# Patient Record
Sex: Female | Born: 1970 | Race: White | Hispanic: No | Marital: Married | Smoking: Never smoker
Health system: Southern US, Community
[De-identification: ages and names within clinical notes are randomized; demographics above are authoritative.]

## PROBLEM LIST (undated history)

## (undated) DIAGNOSIS — E611 Iron deficiency: Secondary | ICD-10-CM

## (undated) DIAGNOSIS — J45909 Unspecified asthma, uncomplicated: Secondary | ICD-10-CM

## (undated) DIAGNOSIS — J40 Bronchitis, not specified as acute or chronic: Secondary | ICD-10-CM

## (undated) DIAGNOSIS — R112 Nausea with vomiting, unspecified: Secondary | ICD-10-CM

## (undated) DIAGNOSIS — Z9889 Other specified postprocedural states: Secondary | ICD-10-CM

## (undated) HISTORY — PX: CYST EXCISION: SHX5701

## (undated) HISTORY — PX: VARICOSE VEIN SURGERY: SHX832

## (undated) HISTORY — PX: TUBAL LIGATION: SHX77

---

## 1997-08-09 ENCOUNTER — Inpatient Hospital Stay (HOSPITAL_COMMUNITY): Admission: AD | Admit: 1997-08-09 | Discharge: 1997-08-11 | Payer: Self-pay | Admitting: Obstetrics

## 2000-05-28 ENCOUNTER — Observation Stay (HOSPITAL_COMMUNITY): Admission: AD | Admit: 2000-05-28 | Discharge: 2000-05-29 | Payer: Self-pay | Admitting: Obstetrics & Gynecology

## 2000-06-04 ENCOUNTER — Inpatient Hospital Stay (HOSPITAL_COMMUNITY): Admission: AD | Admit: 2000-06-04 | Discharge: 2000-06-04 | Payer: Self-pay | Admitting: *Deleted

## 2000-06-23 ENCOUNTER — Other Ambulatory Visit: Admission: RE | Admit: 2000-06-23 | Discharge: 2000-06-23 | Payer: Self-pay | Admitting: Obstetrics

## 2001-01-07 ENCOUNTER — Encounter (INDEPENDENT_AMBULATORY_CARE_PROVIDER_SITE_OTHER): Payer: Self-pay | Admitting: Specialist

## 2001-01-07 ENCOUNTER — Inpatient Hospital Stay (HOSPITAL_COMMUNITY): Admission: AD | Admit: 2001-01-07 | Discharge: 2001-01-09 | Payer: Self-pay | Admitting: Obstetrics

## 2001-01-14 ENCOUNTER — Inpatient Hospital Stay (HOSPITAL_COMMUNITY): Admission: AD | Admit: 2001-01-14 | Discharge: 2001-01-16 | Payer: Self-pay | Admitting: Obstetrics

## 2009-03-08 ENCOUNTER — Emergency Department (HOSPITAL_COMMUNITY): Admission: EM | Admit: 2009-03-08 | Discharge: 2009-03-08 | Payer: Self-pay | Admitting: Emergency Medicine

## 2010-02-25 ENCOUNTER — Encounter: Admission: RE | Admit: 2010-02-25 | Discharge: 2010-02-25 | Payer: Self-pay | Admitting: Family Medicine

## 2010-08-28 NOTE — Discharge Summary (Signed)
[facility]  Patient:    Tara Houston, Tara Houston Visit Number: [id number] MRN: [id number]          Service Type: OBS Location: 910A 9139 01 Attending Physician:  Venita Sheffield Dictated by:   Kathreen Cosier, M.D. Admit Date:  01/07/2001 Disc. Date: 01/09/01                             Discharge Summary  HOSPITAL COURSE:              The patient is a 40 year old, gravida 6, para 2-0-0-3, [facility] January 05, 2001 who was admitted in labor 7 cm, 100%, vertex, -1. She had a normal vaginal delivery of a female, Apgars 9 and 9; no episiotomy, no laceration. The patient desired sterilization. On admission, her hemoglobin was 12.1 and at discharge was 10.4. She underwent a postpartum tubal on January 08, 2001. Postoperatively, she did well and was discharged home on the second postpartum day, ambulatory on a regular diet.  DISCHARGE MEDICATIONS:        Tylenol #3.  DISCHARGE FOLLOWUP:           The patient is to see Dr. Gaynell Face in six weeks.  DISCHARGE DIAGNOSES:          1. Status post normal vaginal delivery at term.                               2. Postpartum tubal ligation. Dictated by:   Kathreen Cosier, M.D. Attending Physician:  Venita Sheffield DD:  01/09/01 TD:  01/09/01 Job: [id number]

## 2010-08-28 NOTE — Op Note (Signed)
[facility]  Patient:    Tara Houston, Tara Houston Visit Number: [id number] MRN: [id number]          Service Type: Attending:  Kathreen Cosier, M.D. Dictated by:   Kathreen Cosier, M.D. Proc. Date: 01/08/01                             Operative Report  PREOPERATIVE DIAGNOSIS:       Multiparity.  OPERATION:                    Postpartum tubal ligation.  SURGEON:                      Kathreen Cosier, M.D.  ANESTHESIA:                   General.  Failed epidural.  DESCRIPTION OF PROCEDURE:     The patient was placed on the operating table in the supine position.  General anesthesia was administered.  The abdomen was prepped and draped.  The bladder was emptied with a straight catheter.  A midline subumbilical incision one inch long was made and carried down to the fascia.  The fascia was cleanly grasped with two Kochers.  The fascia and the peritoneum was opened with Mayo scissors.  The left tube was grasped in the midportion with a Babcock clamp.  The tube was traced to the fimbria.  Then, 0 plain suture was placed in the midsalpinx below the portion of tube in the clamp.  This was tied and approximately one inch of tube was transected. Hemostasis was satisfactory.  The procedure was done in a similar fashion on the other side.  The abdomen was closed in layers.  The peritoneum and fascia were closed with continuous suture of 2-0 Dexon.  The skin was closed with subcuticular suture of 3-0 plain.  Blood loss was 5 cc.  The patient tolerated the procedure well and was taken to the recovery room in good condition. Dictated by:   Kathreen Cosier, M.D. Attending:  Kathreen Cosier, M.D. DD:  01/08/01 TD:  01/08/01 Job: [id number]

## 2010-08-28 NOTE — Discharge Summary (Signed)
[facility]  Patient:    SHEQUITA, PEPLINSKI Visit Number: [id number] MRN: [id number]          Service Type: MED Location: 9300 [id number] Attending Physician:  Venita Sheffield Dictated by:   Kathreen Cosier, M.D. Admit Date:  01/14/2001 Discharge Date: 01/16/2001                             Discharge Summary  HISTORY:                      Patient is a 40 year old gravida 6, para 2-0-3-2, [facility] January 05, 2001 who was admitted in labor.  Had a normal vaginal delivery of a female Apgar 9/9.  The patient desired sterilization. On admission hemoglobin was 12.1, post delivery 10.4.  On September 29 she underwent a postpartum tubal ligation and did well and was discharged home on September 30 to see me in six weeks.  DISCHARGE DIAGNOSES:          Status post normal vaginal delivery at term, postpartum tubal ligation. Dictated by:   Kathreen Cosier, M.D. Attending Physician:  Venita Sheffield DD:  04/15/01 TD:  04/15/01 Job: [id number]

## 2012-07-11 ENCOUNTER — Telehealth: Payer: Self-pay | Admitting: Neurology

## 2012-07-11 NOTE — Telephone Encounter (Signed)
Called the patient and left a message on voicemail to return call to the office regarding referral for sleep consultation.

## 2012-07-15 ENCOUNTER — Encounter: Payer: Self-pay | Admitting: Neurology

## 2012-07-15 NOTE — Progress Notes (Signed)
Elpidio Anis, PA is referring this patient for the evaluation of sleep apnea.  Wt. 248 lbs., Ht. 66 in., BMI - 40.05  EDS Fatigue Snoring -Thunderous Morbid Obesity Non-Restorative Sleep Morning Headache Witnessed Apneas  Medication List Pt is currently not taking any medications.  This patient presents to Elpidio Anis, [place] with concern over her excessive daytime sleepiness and daytime fatigue.  She endorses Epworth at 14.  She says that she has trouble staying awake during the daytime even when she sleeps at night.  Her husband and children complain that the patients snoring is thunderous.  Pt says that she awakens frequently from her snoring while noticing at the time that she was breathing differently.  Pt has had a 16 lb weight gain within the past 5 months.  Elpidio Anis, PA feels patients symptoms are highly suspicious for sleep apnea.

## 2012-07-17 NOTE — Progress Notes (Signed)
This patient qualifies for attended sleep study based on clinical history. Morbid obesity and additional weight gain, Epworth .   Patient will need a SPLIT at AHI 10 and 3% scoring.  Offer lunesta prn,  A CO2 measurement is  necessary.

## 2012-07-18 ENCOUNTER — Other Ambulatory Visit: Payer: Self-pay | Admitting: Neurology

## 2012-07-18 ENCOUNTER — Other Ambulatory Visit: Payer: Self-pay | Admitting: *Deleted

## 2012-07-18 DIAGNOSIS — G4730 Sleep apnea, unspecified: Secondary | ICD-10-CM

## 2012-07-18 DIAGNOSIS — R0683 Snoring: Secondary | ICD-10-CM

## 2012-07-18 DIAGNOSIS — G4733 Obstructive sleep apnea (adult) (pediatric): Secondary | ICD-10-CM

## 2015-03-31 ENCOUNTER — Other Ambulatory Visit: Payer: Self-pay | Admitting: Obstetrics and Gynecology

## 2015-03-31 DIAGNOSIS — D249 Benign neoplasm of unspecified breast: Secondary | ICD-10-CM

## 2015-04-25 ENCOUNTER — Other Ambulatory Visit: Payer: Self-pay | Admitting: Obstetrics and Gynecology

## 2015-04-25 DIAGNOSIS — D251 Intramural leiomyoma of uterus: Secondary | ICD-10-CM

## 2015-05-02 ENCOUNTER — Ambulatory Visit
Admission: RE | Admit: 2015-05-02 | Discharge: 2015-05-02 | Disposition: A | Discharge status: Home / Self Care | Payer: No Typology Code available for payment source | Source: Ambulatory Visit | Attending: Obstetrics and Gynecology | Admitting: Obstetrics and Gynecology

## 2015-05-02 ENCOUNTER — Other Ambulatory Visit: Payer: Self-pay

## 2015-05-02 DIAGNOSIS — D251 Intramural leiomyoma of uterus: Secondary | ICD-10-CM

## 2015-05-28 ENCOUNTER — Other Ambulatory Visit: Payer: Self-pay | Admitting: Obstetrics and Gynecology

## 2015-05-28 DIAGNOSIS — R928 Other abnormal and inconclusive findings on diagnostic imaging of breast: Secondary | ICD-10-CM

## 2015-06-03 ENCOUNTER — Ambulatory Visit
Admission: RE | Admit: 2015-06-03 | Discharge: 2015-06-03 | Disposition: A | Discharge status: Home / Self Care | Payer: No Typology Code available for payment source | Source: Ambulatory Visit | Attending: Obstetrics and Gynecology | Admitting: Obstetrics and Gynecology

## 2015-06-03 DIAGNOSIS — R928 Other abnormal and inconclusive findings on diagnostic imaging of breast: Secondary | ICD-10-CM

## 2015-06-13 ENCOUNTER — Other Ambulatory Visit: Payer: Self-pay | Admitting: Obstetrics and Gynecology

## 2015-06-13 DIAGNOSIS — D259 Leiomyoma of uterus, unspecified: Secondary | ICD-10-CM

## 2015-06-14 ENCOUNTER — Ambulatory Visit
Admission: RE | Admit: 2015-06-14 | Discharge: 2015-06-14 | Disposition: A | Discharge status: Home / Self Care | Payer: Self-pay | Source: Ambulatory Visit | Attending: Obstetrics and Gynecology | Admitting: Obstetrics and Gynecology

## 2015-06-14 DIAGNOSIS — D259 Leiomyoma of uterus, unspecified: Secondary | ICD-10-CM

## 2015-11-03 ENCOUNTER — Other Ambulatory Visit: Payer: Self-pay | Admitting: Obstetrics and Gynecology

## 2015-11-03 DIAGNOSIS — D259 Leiomyoma of uterus, unspecified: Secondary | ICD-10-CM

## 2015-11-04 ENCOUNTER — Other Ambulatory Visit: Payer: Self-pay

## 2015-11-10 ENCOUNTER — Other Ambulatory Visit: Payer: Self-pay | Admitting: Obstetrics and Gynecology

## 2015-11-10 DIAGNOSIS — D259 Leiomyoma of uterus, unspecified: Secondary | ICD-10-CM

## 2015-11-18 ENCOUNTER — Ambulatory Visit
Admission: RE | Admit: 2015-11-18 | Discharge: 2015-11-18 | Disposition: A | Discharge status: Home / Self Care | Payer: Self-pay | Source: Ambulatory Visit | Attending: Obstetrics and Gynecology | Admitting: Obstetrics and Gynecology

## 2015-11-18 ENCOUNTER — Other Ambulatory Visit: Payer: Self-pay | Admitting: Obstetrics and Gynecology

## 2015-11-18 DIAGNOSIS — D259 Leiomyoma of uterus, unspecified: Secondary | ICD-10-CM

## 2015-11-18 DIAGNOSIS — R921 Mammographic calcification found on diagnostic imaging of breast: Secondary | ICD-10-CM

## 2015-11-19 ENCOUNTER — Ambulatory Visit
Admission: RE | Admit: 2015-11-19 | Discharge: 2015-11-19 | Disposition: A | Discharge status: Home / Self Care | Payer: No Typology Code available for payment source | Source: Ambulatory Visit | Attending: Obstetrics and Gynecology | Admitting: Obstetrics and Gynecology

## 2015-11-19 DIAGNOSIS — D259 Leiomyoma of uterus, unspecified: Secondary | ICD-10-CM

## 2015-11-26 ENCOUNTER — Ambulatory Visit
Admission: RE | Admit: 2015-11-26 | Discharge: 2015-11-26 | Disposition: A | Discharge status: Home / Self Care | Payer: 59 | Source: Ambulatory Visit | Attending: Obstetrics and Gynecology | Admitting: Obstetrics and Gynecology

## 2015-11-26 DIAGNOSIS — R921 Mammographic calcification found on diagnostic imaging of breast: Secondary | ICD-10-CM

## 2016-04-21 ENCOUNTER — Other Ambulatory Visit: Payer: Self-pay | Admitting: Obstetrics and Gynecology

## 2016-04-21 DIAGNOSIS — D259 Leiomyoma of uterus, unspecified: Secondary | ICD-10-CM

## 2016-05-06 ENCOUNTER — Ambulatory Visit
Admission: RE | Admit: 2016-05-06 | Discharge: 2016-05-06 | Disposition: A | Discharge status: Home / Self Care | Payer: No Typology Code available for payment source | Source: Ambulatory Visit | Attending: Obstetrics and Gynecology | Admitting: Obstetrics and Gynecology

## 2016-05-06 DIAGNOSIS — D259 Leiomyoma of uterus, unspecified: Secondary | ICD-10-CM

## 2016-05-08 ENCOUNTER — Ambulatory Visit
Admission: RE | Admit: 2016-05-08 | Discharge: 2016-05-08 | Disposition: A | Discharge status: Home / Self Care | Payer: No Typology Code available for payment source | Source: Ambulatory Visit | Attending: Obstetrics and Gynecology | Admitting: Obstetrics and Gynecology

## 2016-05-08 DIAGNOSIS — D259 Leiomyoma of uterus, unspecified: Secondary | ICD-10-CM

## 2016-05-14 ENCOUNTER — Other Ambulatory Visit: Payer: Self-pay | Admitting: Obstetrics and Gynecology

## 2016-05-14 DIAGNOSIS — R921 Mammographic calcification found on diagnostic imaging of breast: Secondary | ICD-10-CM

## 2016-05-19 ENCOUNTER — Ambulatory Visit
Admission: RE | Admit: 2016-05-19 | Discharge: 2016-05-19 | Disposition: A | Discharge status: Home / Self Care | Payer: No Typology Code available for payment source | Source: Ambulatory Visit | Attending: Obstetrics and Gynecology | Admitting: Obstetrics and Gynecology

## 2016-05-19 DIAGNOSIS — R921 Mammographic calcification found on diagnostic imaging of breast: Secondary | ICD-10-CM

## 2016-07-21 ENCOUNTER — Other Ambulatory Visit: Payer: Self-pay | Admitting: Obstetrics and Gynecology

## 2016-07-27 ENCOUNTER — Other Ambulatory Visit: Payer: Self-pay | Admitting: Obstetrics and Gynecology

## 2016-07-27 DIAGNOSIS — D259 Leiomyoma of uterus, unspecified: Secondary | ICD-10-CM

## 2016-08-04 ENCOUNTER — Ambulatory Visit
Admission: RE | Admit: 2016-08-04 | Discharge: 2016-08-04 | Disposition: A | Discharge status: Home / Self Care | Payer: No Typology Code available for payment source | Source: Ambulatory Visit | Attending: Obstetrics and Gynecology | Admitting: Obstetrics and Gynecology

## 2016-08-04 DIAGNOSIS — D259 Leiomyoma of uterus, unspecified: Secondary | ICD-10-CM

## 2016-10-25 ENCOUNTER — Other Ambulatory Visit: Payer: Self-pay | Admitting: Obstetrics and Gynecology

## 2016-10-25 DIAGNOSIS — D259 Leiomyoma of uterus, unspecified: Secondary | ICD-10-CM

## 2016-10-25 DIAGNOSIS — Z006 Encounter for examination for normal comparison and control in clinical research program: Secondary | ICD-10-CM

## 2016-11-03 ENCOUNTER — Ambulatory Visit
Admission: RE | Admit: 2016-11-03 | Discharge: 2016-11-03 | Disposition: A | Discharge status: Home / Self Care | Payer: No Typology Code available for payment source | Source: Ambulatory Visit | Attending: Obstetrics and Gynecology | Admitting: Obstetrics and Gynecology

## 2016-11-03 DIAGNOSIS — D259 Leiomyoma of uterus, unspecified: Secondary | ICD-10-CM

## 2016-11-03 DIAGNOSIS — Z006 Encounter for examination for normal comparison and control in clinical research program: Secondary | ICD-10-CM

## 2017-03-28 ENCOUNTER — Other Ambulatory Visit: Payer: Self-pay | Admitting: Obstetrics and Gynecology

## 2017-03-28 DIAGNOSIS — Z006 Encounter for examination for normal comparison and control in clinical research program: Secondary | ICD-10-CM

## 2017-04-21 ENCOUNTER — Ambulatory Visit
Admission: RE | Admit: 2017-04-21 | Discharge: 2017-04-21 | Disposition: A | Discharge status: Home / Self Care | Payer: No Typology Code available for payment source | Source: Ambulatory Visit | Attending: Obstetrics and Gynecology | Admitting: Obstetrics and Gynecology

## 2017-04-21 DIAGNOSIS — Z006 Encounter for examination for normal comparison and control in clinical research program: Secondary | ICD-10-CM

## 2017-06-24 ENCOUNTER — Other Ambulatory Visit: Payer: Self-pay | Admitting: Obstetrics and Gynecology

## 2017-06-24 DIAGNOSIS — R921 Mammographic calcification found on diagnostic imaging of breast: Secondary | ICD-10-CM

## 2017-07-15 ENCOUNTER — Ambulatory Visit
Admission: RE | Admit: 2017-07-15 | Discharge: 2017-07-15 | Disposition: A | Discharge status: Home / Self Care | Payer: 59 | Source: Ambulatory Visit | Attending: Obstetrics and Gynecology | Admitting: Obstetrics and Gynecology

## 2017-07-15 ENCOUNTER — Other Ambulatory Visit: Payer: Self-pay | Admitting: Obstetrics and Gynecology

## 2017-07-15 ENCOUNTER — Encounter: Payer: Self-pay | Admitting: Radiology

## 2017-07-15 DIAGNOSIS — N6310 Unspecified lump in the right breast, unspecified quadrant: Secondary | ICD-10-CM

## 2017-07-15 DIAGNOSIS — R921 Mammographic calcification found on diagnostic imaging of breast: Secondary | ICD-10-CM

## 2018-09-08 ENCOUNTER — Other Ambulatory Visit: Payer: Self-pay | Admitting: Obstetrics and Gynecology

## 2018-09-08 DIAGNOSIS — Z1231 Encounter for screening mammogram for malignant neoplasm of breast: Secondary | ICD-10-CM

## 2018-10-27 ENCOUNTER — Ambulatory Visit
Admission: RE | Admit: 2018-10-27 | Discharge: 2018-10-27 | Disposition: A | Discharge status: Home / Self Care | Payer: 59 | Source: Ambulatory Visit | Attending: Obstetrics and Gynecology | Admitting: Obstetrics and Gynecology

## 2018-10-27 ENCOUNTER — Other Ambulatory Visit: Payer: Self-pay

## 2018-10-27 DIAGNOSIS — Z1231 Encounter for screening mammogram for malignant neoplasm of breast: Secondary | ICD-10-CM

## 2019-06-16 ENCOUNTER — Ambulatory Visit: Payer: 59 | Attending: Internal Medicine

## 2019-06-16 DIAGNOSIS — Z23 Encounter for immunization: Secondary | ICD-10-CM

## 2019-06-16 NOTE — Progress Notes (Signed)
   Covid-19 Vaccination Clinic  Name:  Tara Houston    MRN: [id number] DOB: Nov 23, 1970  06/16/2019  Tara Houston was observed post Covid-19 immunization for 15 minutes without incident. She was provided with Vaccine Information Sheet and instruction to access the V-Safe system.   Tara Houston was instructed to call 911 with any severe reactions post vaccine: Marland Kitchen Difficulty breathing  . Swelling of face and throat  . A fast heartbeat  . A bad rash all over body  . Dizziness and weakness   Immunizations Administered    Name Date Dose VIS Date Route   Pfizer COVID-19 Vaccine 06/16/2019 11:24 AM 0.3 mL 03/23/2019 Intramuscular   Manufacturer: [place]   Lot: [id number]   [place]: [id number]

## 2019-07-07 ENCOUNTER — Ambulatory Visit: Payer: 59 | Attending: Internal Medicine

## 2019-07-07 DIAGNOSIS — Z23 Encounter for immunization: Secondary | ICD-10-CM

## 2019-07-07 NOTE — Progress Notes (Signed)
   Covid-19 Vaccination Clinic  Name:  Donisha Kluttz    MRN: [id number] DOB: 1971/02/24  07/07/2019  Ms. Martinie was observed post Covid-19 immunization for 15 minutes without incident. She was provided with Vaccine Information Sheet and instruction to access the V-Safe system.   Ms. Howington was instructed to call 911 with any severe reactions post vaccine: Marland Kitchen Difficulty breathing  . Swelling of face and throat  . A fast heartbeat  . A bad rash all over body  . Dizziness and weakness   Immunizations Administered    Name Date Dose VIS Date Route   Pfizer COVID-19 Vaccine 07/07/2019 11:02 AM 0.3 mL 03/23/2019 Intramuscular   Manufacturer: [place]   Lot: [id number]   [place]: [id number]

## 2019-09-26 ENCOUNTER — Other Ambulatory Visit: Payer: Self-pay | Admitting: Obstetrics & Gynecology

## 2019-09-26 DIAGNOSIS — Z1231 Encounter for screening mammogram for malignant neoplasm of breast: Secondary | ICD-10-CM

## 2019-10-29 ENCOUNTER — Other Ambulatory Visit: Payer: Self-pay

## 2019-10-29 ENCOUNTER — Ambulatory Visit
Admission: RE | Admit: 2019-10-29 | Discharge: 2019-10-29 | Disposition: A | Discharge status: Home / Self Care | Payer: 59 | Source: Ambulatory Visit | Attending: Obstetrics & Gynecology | Admitting: Obstetrics & Gynecology

## 2019-10-29 DIAGNOSIS — Z1231 Encounter for screening mammogram for malignant neoplasm of breast: Secondary | ICD-10-CM

## 2019-11-11 NOTE — H&P (Signed)
[name] who presents for HSC/D&C/ablation due to AUB:  In review, recently had prolonged/moderate to heavy period with 2 weeks of light spotting then a very heavy bleeding x 1wk- she had been called in progesterone, which did in fact improve her bleeding.  The irregular bleeding has been an ongoing issue.  Over the past few months, she may have bleeding for up to 3 weeks- bleeding may vary from light to moderate/heavy flow. On a moderate day she may use 5-6 pads/tampons per day. Bleeding will stop for a week and then return. This started in Tara Houston- currently bleeding. On the plus side, has less dysmenorrhea than she used to.  Work up included: [place] (10/11/19): 11.9cm anteverted uterus with several fibroids- largest 3.4cm (intramural). Thickened endometrium- no BV noted. Left ovary with 2.8cm cyst- low level internal echoes- avascular. Right ovary not visualized. EMB: 10/2019- benign proliferative endometrium  Denies abnormal discharge, itching or irritation. Denies pelvic or abdominal pain outside of her period. No urinary concerns. No sexual dysfunction. No other acute complaints  Contraception: tubal ligation.      ROS:  CONSTITUTIONAL:  no Appetite changes. no Chills. no Fatigue. no Fever.  CARDIOLOGY:  no Chest pain.  RESPIRATORY:  no Shortness of breath. no Cough.  UROLOGY:  no Dysuria. no Urinary frequency. no Urinary incontinence. no Urinary urgency.  GASTROENTEROLOGY:  no Abdominal pain. no Change in bowel habits. no Change in bowel movements.  FEMALE REPRODUCTIVE:  See HPI for details. Abnormal vaginal bleeding yes. no Abnormal vaginal discharge. no Breast lumps or discharge. no Breast pain. no Hot flashes. no Sexual problems. no Vaginal irritation. no Vaginal itching.  NEUROLOGY:  no Dizziness. no Headache.  PSYCHOLOGY:  no Anxiety. no Depression.  SKIN:  no Rash. no Hives.  HEMATOLOGY/LYMPH:  no Anemia. Using Blood Thinners no.         Medical History: Vascular  disease, Fibroids.        Gyn History:  Sexual activity currently sexually active.  Periods : every month.  LMP 09/29/2019.  Birth control BTL.  Last pap smear date 2019 .  Last mammogram date july 2020.  STD HPV once .  GYN procedures LEEP.  Other: h/o fibroids.        OB History:  Number of pregnancies 6.  abortion 3.  Pregnancy # 1 vaginal delivery.  Pregnancy # 2 vaginal delivery.  Pregnancy # 3 vaginal delivery.        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        Hospitalization/Major Diagnostic Procedure: child birth .        Family History: Father: deceased, dematia, diagnosed with Diabetes. Mother: alive, heart disease , breast cancer.        Social History:  General:  Tobacco use  cigarettes: Never smoked Tobacco history last updated 11/06/2019 EXPOSURE TO PASSIVE SMOKE: no.  Alcohol: wine, occasionally.  Caffeine: yes,, 2 servings daily.  Marital Status: Married.  Children: 3, girls.        Medications:  Vitamin B 12 500 MCG Tablet 1 tablet Orally Once a day,  Vitamin Supplement E-400(Vitamin E) 400 UNIT Capsule 1 capsule Orally Once a day, Vitamin C 500 MG Capsule as directed Orally , Not-Taking medroxyPROGESTERone Acetate 10 MG Tablet TAKE 1 TABLET BY MOUTH TWICE DAILY WITH FOOD FOR 10 DAYS , Not-Taking Junel Fe 24(Norethin Ace-Eth Estrad-FE) 1-20 MG-MCG(24) Tablet 1 tablet Orally Once a day, Discontinued Vitamin  B12 100 MCG Tablet as directed Orally , Discontinued Glucosamine-Chondroitin Sulf - Tablet as directed Orally , Medication List reviewed and reconciled with the patient       Allergies: Seasonal.    Objective:     Vitals: Wt 247.7, Wt change -4 lb, Ht 68, BMI 37.66, Pulse sitting 96, BP sitting 124/80.       Examination:  General Examination: CONSTITUTIONAL: well developed, well nourished.  SKIN: warm and dry, no rashes.  NECK: supple, normal appearance.  LUNGS: regular  breathing rate and effort.  HEART: no murmurs, regular rate and rhythm.  ABDOMEN: soft and not tender, no rebound, no rigidity.  FEMALE GENITOURINARY: normal external genitalia, labia - unremarkable, vagina - pink moist mucosa, no lesions or abnormal discharge, cervix - no discharge or lesions or CMT.  MUSCULOSKELETAL no calf tenderness bilaterally.  EXTREMITIES: no edema present.  PSYCH: appropriate mood and affect.    A/P: 03TC [id number] who presents for hysteroscopy, D&C, ablation due to AUB -NPO -LR @ 125cc/hr -SCDs to OR -Risk/benefit and alternatives reviewed including risk of bleeding, infection and injury due to uterine perforation.  Questions and concerns were addressed and she desires to proceed  Tara Pupa, DO [phone] (cell) [phone] (office)

## 2019-11-13 ENCOUNTER — Other Ambulatory Visit: Payer: Self-pay

## 2019-11-13 ENCOUNTER — Encounter (HOSPITAL_BASED_OUTPATIENT_CLINIC_OR_DEPARTMENT_OTHER): Payer: Self-pay | Admitting: Obstetrics & Gynecology

## 2019-11-17 ENCOUNTER — Other Ambulatory Visit (HOSPITAL_COMMUNITY)
Admission: RE | Admit: 2019-11-17 | Discharge: 2019-11-17 | Disposition: A | Discharge status: Home / Self Care | Payer: 59 | Source: Ambulatory Visit | Attending: Obstetrics & Gynecology | Admitting: Obstetrics & Gynecology

## 2019-11-17 DIAGNOSIS — Z01812 Encounter for preprocedural laboratory examination: Secondary | ICD-10-CM | POA: Diagnosis not present

## 2019-11-17 DIAGNOSIS — Z20822 Contact with and (suspected) exposure to covid-19: Secondary | ICD-10-CM | POA: Diagnosis not present

## 2019-11-17 LAB — SARS CORONAVIRUS 2 (TAT 6-24 HRS): SARS Coronavirus 2: NEGATIVE

## 2019-11-20 ENCOUNTER — Encounter (HOSPITAL_BASED_OUTPATIENT_CLINIC_OR_DEPARTMENT_OTHER)
Admission: RE | Admit: 2019-11-20 | Discharge: 2019-11-20 | Disposition: A | Discharge status: Home / Self Care | Payer: 59 | Source: Ambulatory Visit | Attending: Obstetrics & Gynecology | Admitting: Obstetrics & Gynecology

## 2019-11-20 DIAGNOSIS — N854 Malposition of uterus: Secondary | ICD-10-CM | POA: Diagnosis not present

## 2019-11-20 DIAGNOSIS — D251 Intramural leiomyoma of uterus: Secondary | ICD-10-CM | POA: Diagnosis not present

## 2019-11-20 DIAGNOSIS — N840 Polyp of corpus uteri: Secondary | ICD-10-CM | POA: Diagnosis not present

## 2019-11-20 DIAGNOSIS — Z01812 Encounter for preprocedural laboratory examination: Secondary | ICD-10-CM | POA: Diagnosis not present

## 2019-11-20 DIAGNOSIS — N8500 Endometrial hyperplasia, unspecified: Secondary | ICD-10-CM | POA: Diagnosis not present

## 2019-11-20 DIAGNOSIS — N939 Abnormal uterine and vaginal bleeding, unspecified: Secondary | ICD-10-CM | POA: Diagnosis not present

## 2019-11-20 LAB — CBC
HCT: 27.6 % — ABNORMAL LOW (ref 36.0–46.0)
Hemoglobin: 8 g/dL — ABNORMAL LOW (ref 12.0–15.0)
MCH: 22.8 pg — ABNORMAL LOW (ref 26.0–34.0)
MCHC: 29 g/dL — ABNORMAL LOW (ref 30.0–36.0)
MCV: 78.6 fL — ABNORMAL LOW (ref 80.0–100.0)
Platelets: 535 10*3/uL — ABNORMAL HIGH (ref 150–400)
RBC: 3.51 MIL/uL — ABNORMAL LOW (ref 3.87–5.11)
RDW: 14.9 % (ref 11.5–15.5)
WBC: 9.9 10*3/uL (ref 4.0–10.5)
nRBC: 0 % (ref 0.0–0.2)

## 2019-11-20 LAB — POCT PREGNANCY, URINE: Preg Test, Ur: NEGATIVE

## 2019-11-20 LAB — COMPREHENSIVE METABOLIC PANEL
ALT: 14 U/L (ref 0–44)
AST: 15 U/L (ref 15–41)
Albumin: 3.4 g/dL — ABNORMAL LOW (ref 3.5–5.0)
Alkaline Phosphatase: 57 U/L (ref 38–126)
Anion gap: 10 (ref 5–15)
BUN: 6 mg/dL (ref 6–20)
CO2: 25 mmol/L (ref 22–32)
Calcium: 9.2 mg/dL (ref 8.9–10.3)
Chloride: 103 mmol/L (ref 98–111)
Creatinine, Ser: 0.61 mg/dL (ref 0.44–1.00)
GFR calc Af Amer: >60 mL/min (ref >60)
GFR calc non Af Amer: >60 mL/min (ref >60)
Glucose, Bld: 94 mg/dL (ref 70–99)
Potassium: 3.7 mmol/L (ref 3.5–5.1)
Sodium: 138 mmol/L (ref 135–145)
Total Bilirubin: 0.4 mg/dL (ref 0.3–1.2)
Total Protein: 6.9 g/dL (ref 6.5–8.1)

## 2019-11-20 LAB — TYPE AND SCREEN
ABO/RH(D): A POS
Antibody Screen: NEGATIVE

## 2019-11-20 NOTE — Progress Notes (Signed)

## 2019-11-21 ENCOUNTER — Encounter (HOSPITAL_BASED_OUTPATIENT_CLINIC_OR_DEPARTMENT_OTHER): Payer: Self-pay | Admitting: Obstetrics & Gynecology

## 2019-11-21 ENCOUNTER — Ambulatory Visit (HOSPITAL_BASED_OUTPATIENT_CLINIC_OR_DEPARTMENT_OTHER)
Admission: RE | Admit: 2019-11-21 | Discharge: 2019-11-21 | Disposition: A | Discharge status: Home / Self Care | Payer: 59 | Attending: Obstetrics & Gynecology | Admitting: Obstetrics & Gynecology

## 2019-11-21 ENCOUNTER — Ambulatory Visit (HOSPITAL_BASED_OUTPATIENT_CLINIC_OR_DEPARTMENT_OTHER): Payer: 59 | Admitting: Certified Registered"

## 2019-11-21 ENCOUNTER — Encounter (HOSPITAL_BASED_OUTPATIENT_CLINIC_OR_DEPARTMENT_OTHER)
Admission: RE | Disposition: A | Discharge status: Home / Self Care | Payer: Self-pay | Source: Home / Self Care | Attending: Obstetrics & Gynecology

## 2019-11-21 ENCOUNTER — Other Ambulatory Visit: Payer: Self-pay

## 2019-11-21 DIAGNOSIS — N854 Malposition of uterus: Secondary | ICD-10-CM | POA: Insufficient documentation

## 2019-11-21 DIAGNOSIS — N840 Polyp of corpus uteri: Secondary | ICD-10-CM | POA: Insufficient documentation

## 2019-11-21 DIAGNOSIS — N939 Abnormal uterine and vaginal bleeding, unspecified: Secondary | ICD-10-CM

## 2019-11-21 DIAGNOSIS — Z01812 Encounter for preprocedural laboratory examination: Secondary | ICD-10-CM | POA: Insufficient documentation

## 2019-11-21 DIAGNOSIS — N8500 Endometrial hyperplasia, unspecified: Secondary | ICD-10-CM | POA: Insufficient documentation

## 2019-11-21 DIAGNOSIS — D251 Intramural leiomyoma of uterus: Secondary | ICD-10-CM | POA: Insufficient documentation

## 2019-11-21 HISTORY — DX: Unspecified asthma, uncomplicated: J45.909

## 2019-11-21 HISTORY — DX: Other specified postprocedural states: R11.2

## 2019-11-21 HISTORY — DX: Other specified postprocedural states: Z98.890

## 2019-11-21 HISTORY — PX: DILITATION & CURRETTAGE/HYSTROSCOPY WITH HYDROTHERMAL ABLATION: SHX5570

## 2019-11-21 SURGERY — DILATATION & CURETTAGE/HYSTEROSCOPY WITH HYDROTHERMAL ABLATION
Anesthesia: General | Site: Vagina

## 2019-11-21 MED ORDER — LIDOCAINE-EPINEPHRINE 1 %-1:100000 IJ SOLN
INTRAMUSCULAR | Status: AC
  Filled 2019-11-21: qty 1

## 2019-11-21 MED ORDER — SILVER NITRATE-POT NITRATE 75-25 % EX MISC
CUTANEOUS | Status: AC
  Filled 2019-11-21: qty 10

## 2019-11-21 MED ORDER — LACTATED RINGERS IV SOLN: INTRAVENOUS | Status: DC

## 2019-11-21 MED ORDER — HYDROMORPHONE HCL 1 MG/ML IJ SOLN
INTRAMUSCULAR | Status: AC
  Filled 2019-11-21: qty 0.5

## 2019-11-21 MED ORDER — PROPOFOL 10 MG/ML IV BOLUS
INTRAVENOUS | Status: AC
  Filled 2019-11-21: qty 20

## 2019-11-21 MED ORDER — DEXAMETHASONE SODIUM PHOSPHATE 10 MG/ML IJ SOLN
INTRAMUSCULAR | Status: DC | PRN
  Administered 2019-11-21: 5 mg via INTRAVENOUS

## 2019-11-21 MED ORDER — ONDANSETRON HCL 4 MG/2ML IJ SOLN
INTRAMUSCULAR | Status: DC | PRN
  Administered 2019-11-21: 4 mg via INTRAVENOUS

## 2019-11-21 MED ORDER — FENTANYL CITRATE (PF) 250 MCG/5ML IJ SOLN
INTRAMUSCULAR | Status: DC | PRN
  Administered 2019-11-21 (×4): 25 ug via INTRAVENOUS

## 2019-11-21 MED ORDER — OXYCODONE-ACETAMINOPHEN 5-325 MG PO TABS: 1.0000 | ORAL_TABLET | Freq: Four times a day (QID) | ORAL | 0 refills | Status: AC | PRN

## 2019-11-21 MED ORDER — FENTANYL CITRATE (PF) 100 MCG/2ML IJ SOLN
INTRAMUSCULAR | Status: AC
  Filled 2019-11-21: qty 2

## 2019-11-21 MED ORDER — PROMETHAZINE HCL 25 MG/ML IJ SOLN: 6.2500 mg | INTRAMUSCULAR | Status: DC | PRN

## 2019-11-21 MED ORDER — OXYCODONE HCL 5 MG/5ML PO SOLN: 5.0000 mg | Freq: Once | ORAL | Status: DC | PRN

## 2019-11-21 MED ORDER — LIDOCAINE 2% (20 MG/ML) 5 ML SYRINGE
INTRAMUSCULAR | Status: AC
  Filled 2019-11-21: qty 5

## 2019-11-21 MED ORDER — PROPOFOL 10 MG/ML IV BOLUS
INTRAVENOUS | Status: DC | PRN
  Administered 2019-11-21: 200 mg via INTRAVENOUS

## 2019-11-21 MED ORDER — MIDAZOLAM HCL 2 MG/2ML IJ SOLN
INTRAMUSCULAR | Status: AC
  Filled 2019-11-21: qty 2

## 2019-11-21 MED ORDER — LIDOCAINE 2% (20 MG/ML) 5 ML SYRINGE
INTRAMUSCULAR | Status: DC | PRN
  Administered 2019-11-21: 60 mg via INTRAVENOUS

## 2019-11-21 MED ORDER — KETOROLAC TROMETHAMINE 30 MG/ML IJ SOLN
INTRAMUSCULAR | Status: DC | PRN
  Administered 2019-11-21: 30 mg via INTRAVENOUS

## 2019-11-21 MED ORDER — SODIUM CHLORIDE 0.9 % IR SOLN
Status: DC | PRN
  Administered 2019-11-21: 3000 mL

## 2019-11-21 MED ORDER — POVIDONE-IODINE 10 % EX SWAB: 2.0000 "application " | Freq: Once | CUTANEOUS | Status: DC

## 2019-11-21 MED ORDER — MEPERIDINE HCL 25 MG/ML IJ SOLN: 6.2500 mg | INTRAMUSCULAR | Status: DC | PRN

## 2019-11-21 MED ORDER — LIDOCAINE-EPINEPHRINE 1 %-1:100000 IJ SOLN
INTRAMUSCULAR | Status: DC | PRN
  Administered 2019-11-21: 20 mL

## 2019-11-21 MED ORDER — OXYCODONE HCL 5 MG PO TABS: 5.0000 mg | ORAL_TABLET | Freq: Once | ORAL | Status: DC | PRN

## 2019-11-21 MED ORDER — HYDROMORPHONE HCL 1 MG/ML IJ SOLN
0.2500 mg | INTRAMUSCULAR | Status: DC | PRN
  Administered 2019-11-21: 0.5 mg via INTRAVENOUS

## 2019-11-21 MED ORDER — DROPERIDOL 2.5 MG/ML IJ SOLN
INTRAMUSCULAR | Status: DC | PRN
Start: 2019-11-21 — End: 2019-11-21
  Administered 2019-11-21: .625 mg via INTRAVENOUS

## 2019-11-21 MED ORDER — MIDAZOLAM HCL 5 MG/5ML IJ SOLN
INTRAMUSCULAR | Status: DC | PRN
  Administered 2019-11-21: 2 mg via INTRAVENOUS

## 2019-11-21 SURGICAL SUPPLY — 17 items
CATH ROBINSON RED A/P 16FR (CATHETERS) IMPLANT
CONTAINER PREFILL 10% NBF 60ML (FORM) ×2 IMPLANT
DILATOR CANAL MILEX (MISCELLANEOUS) ×2 IMPLANT
GAUZE 4X4 16PLY RFD (DISPOSABLE) ×2 IMPLANT
GLOVE BIOGEL PI IND STRL 6.5 (GLOVE) ×1 IMPLANT
GLOVE BIOGEL PI IND STRL 7.0 (GLOVE) ×1 IMPLANT
GLOVE BIOGEL PI INDICATOR 6.5 (GLOVE) ×1
GLOVE BIOGEL PI INDICATOR 7.0 (GLOVE) ×1
GLOVE ECLIPSE 6.5 STRL STRAW (GLOVE) ×2 IMPLANT
GOWN STRL REUS W/ TWL LRG LVL3 (GOWN DISPOSABLE) ×1 IMPLANT
GOWN STRL REUS W/TWL LRG LVL3 (GOWN DISPOSABLE) ×4 IMPLANT
PACK VAGINAL MINOR WOMEN LF (CUSTOM PROCEDURE TRAY) ×2 IMPLANT
PAD OB MATERNITY 4.3X12.25 (PERSONAL CARE ITEMS) ×2 IMPLANT
PAD PREP 24X48 CUFFED NSTRL (MISCELLANEOUS) ×2 IMPLANT
SET GENESYS HTA PROCERVA (MISCELLANEOUS) ×2 IMPLANT
SLEEVE SCD COMPRESS KNEE MED (MISCELLANEOUS) ×2 IMPLANT
TOWEL GREEN STERILE FF (TOWEL DISPOSABLE) ×2 IMPLANT

## 2019-11-21 NOTE — Anesthesia Procedure Notes (Signed)
Procedure Name: LMA Insertion Date/Time: 11/21/2019 8:35 AM Performed by: Myna Bright, CRNA Pre-anesthesia Checklist: Patient identified, Emergency Drugs available, Suction available and Patient being monitored Patient Re-evaluated:Patient Re-evaluated prior to induction Oxygen Delivery Method: Circle system utilized Preoxygenation: Pre-oxygenation with 100% oxygen Induction Type: IV induction Ventilation: Mask ventilation without difficulty LMA: LMA inserted LMA Size: 4.0 Tube type: Oral Number of attempts: 1 Placement Confirmation: positive ETCO2 and breath sounds checked- equal and bilateral Tube secured with: Tape Dental Injury: Teeth and Oropharynx as per pre-operative assessment

## 2019-11-21 NOTE — Discharge Instructions (Addendum)
HOME INSTRUCTIONS  Please note any unusual or excessive bleeding, pain, swelling. Mild dizziness or drowsiness are normal for about 24 hours after surgery.   Shower when comfortable  Restrictions: No driving for 24 hours or while taking pain medications.  Activity:  No heavy lifting (> 10 lbs), nothing in vagina (no tampons, douching, or intercourse) x 2 weeks; no tub baths for 2 weeks Vaginal spotting is expected but if your bleeding is heavy, period like,  please call the office   Diet:  You may return to your regular diet.  Do not eat large meals.  Eat small frequent meals throughout the day.  Continue to drink a good amount of water at least 6-8 glasses of water per day, hydration is very important for the healing process.  Pain Management: Take Motrin and/or Tylenol over the counter as needed.  If pain is moderate to severe- you can take a prescribed percocet- this medication contains tylenol (do not take additional tylenol with this medication)  Always take prescription pain medication with food, it may cause constipation, increase fluids and fiber and you may want to take an over-the-counter stool softener like Colace as needed up to 2x a day.    Alcohol -- Avoid for 24 hours and while taking pain medications.  Nausea: Take sips of ginger ale or soda  Fever -- Call physician if temperature over 101 degrees  Follow up:  If you do not already have a follow up appointment scheduled, please call the office at [phone].  If you experience fever (a temperature greater than 100.4), pain unrelieved by pain medication, shortness of breath, swelling of a single leg, or any other symptoms which are concerning to you please the office immediately.     Post Anesthesia Home Care Instructions  Activity: Get plenty of rest for the remainder of the day. A responsible individual must stay with you for 24 hours following the procedure.  For the next 24 hours, DO NOT: -Drive a car -Conservation officer, nature -Drink alcoholic beverages -Take any medication unless instructed by your physician -Make any legal decisions or sign important papers.  Meals: Start with liquid foods such as gelatin or soup. Progress to regular foods as tolerated. Avoid greasy, spicy, heavy foods. If nausea and/or vomiting occur, drink only clear liquids until the nausea and/or vomiting subsides. Call your physician if vomiting continues.  Special Instructions/Symptoms: Your throat may feel dry or sore from the anesthesia or the breathing tube placed in your throat during surgery. If this causes discomfort, gargle with warm salt water. The discomfort should disappear within 24 hours.  If you had a scopolamine patch placed behind your ear for the management of post- operative nausea and/or vomiting:  1. The medication in the patch is effective for 72 hours, after which it should be removed.  Wrap patch in a tissue and discard in the trash. Wash hands thoroughly with soap and water. 2. You may remove the patch earlier than 72 hours if you experience unpleasant side effects which may include dry mouth, dizziness or visual disturbances. 3. Avoid touching the patch. Wash your hands with soap and water after contact with the patch.    May have Ibuprofen after 5pm today

## 2019-11-21 NOTE — Anesthesia Preprocedure Evaluation (Signed)
Anesthesia Evaluation  Patient identified by MRN, date of birth, ID band Patient awake    Reviewed: Allergy & Precautions, NPO status , Patient's Chart, lab work & pertinent test results  History of Anesthesia Complications (+) PONV  Airway Mallampati: II  TM Distance: >3 FB Neck ROM: Full    Dental no notable dental hx.    Pulmonary asthma ,    Pulmonary exam normal breath sounds clear to auscultation       Cardiovascular negative cardio ROS Normal cardiovascular exam Rhythm:Regular Rate:Normal     Neuro/Psych negative neurological ROS  negative psych ROS   GI/Hepatic negative GI ROS, Neg liver ROS,   Endo/Other  negative endocrine ROS  Renal/GU negative Renal ROS  negative genitourinary   Musculoskeletal negative musculoskeletal ROS (+)   Abdominal (+) + obese,   Peds negative pediatric ROS (+)  Hematology negative hematology ROS (+)   Anesthesia Other Findings   Reproductive/Obstetrics negative OB ROS                             Anesthesia Physical Anesthesia Plan  ASA: II  Anesthesia Plan: General   Post-op Pain Management:    Induction: Intravenous  PONV Risk Score and Plan: 4 or greater and Ondansetron, Dexamethasone, Midazolam, Droperidol and Treatment may vary due to age or medical condition  Airway Management Planned: LMA  Additional Equipment:   Intra-op Plan:   Post-operative Plan: Extubation in OR  Informed Consent: I have reviewed the patients History and Physical, chart, labs and discussed the procedure including the risks, benefits and alternatives for the proposed anesthesia with the patient or authorized representative who has indicated his/her understanding and acceptance.     Dental advisory given  Plan Discussed with: CRNA  Anesthesia Plan Comments:         Anesthesia Quick Evaluation

## 2019-11-21 NOTE — Op Note (Signed)
Operative Report  PreOp: abnormal uterine bleeding PostOp: same Procedure:  Hysteroscopy, Dilation and Curettage, Endometrial ablation Surgeon: Dr. Janyth Pupa Anesthesia: General Complications:none EBL: Minimal IVF:600cc  Findings:10cm anteverted uterus with proliferative endometrium.  Bilateral ostia seen  Specimens: endometrial curettings  Procedure: The patient was taken to the operating room where she underwent general anesthesia without difficulty. The patient was placed in a low lithotomy position using Allen stirrups. She was prepped and draped in the normal sterile fashion. A sterile speculum was inserted into the vagina. A single tooth tenaculum was placed on the anterior lip of the cervix. The uterus was then sounded to 10cm.  The diagnostic hysteroscope was then inserted without difficulty and noted to have the findings as listed above. The hysteroscope was removed and sharp curettage was performed. The tissue was sent to pathology.   Attention was then turned to the HTA system.  The HTA system/hysteroscope was reinserted.  Cavity assessment was performed and passed. The device was activated and ablation was completed under direct visualization.  Global ablation was visualized and no uterine perforation was seen. All instrument were then removed. Hemostasis was observed at the cervical site. The patient was repositioned to the supine position. The patient tolerated the procedure without any complications and taken to recovery in stable condition.   Janyth Pupa, DO [phone] (pager) [phone] (office)

## 2019-11-21 NOTE — Anesthesia Postprocedure Evaluation (Signed)
Anesthesia Post Note  Patient: Tara Houston  Procedure(s) Performed: DILATATION & CURETTAGE/HYSTEROSCOPY WITH HYDROTHERMAL ABLATION (N/A Vagina )     Patient location during evaluation: PACU Anesthesia Type: General Level of consciousness: awake and alert Pain management: pain level controlled Vital Signs Assessment: post-procedure vital signs reviewed and stable Respiratory status: spontaneous breathing, nonlabored ventilation and respiratory function stable Cardiovascular status: blood pressure returned to baseline and stable Postop Assessment: no apparent nausea or vomiting Anesthetic complications: no   No complications documented.  Last Vitals:  Vitals:   11/21/19 0946 11/21/19 1015  BP:  126/74  Pulse: 82 80  Resp: 19 18  Temp:    SpO2: 97% 98%    Last Pain:  Vitals:   11/21/19 0945  TempSrc:   PainSc: [place]

## 2019-11-21 NOTE — Transfer of Care (Signed)
Immediate Anesthesia Transfer of Care Note  Patient: Tara Houston  Procedure(s) Performed: DILATATION & CURETTAGE/HYSTEROSCOPY WITH HYDROTHERMAL ABLATION (N/A Vagina )  Patient Location: PACU  Anesthesia Type:General  Level of Consciousness: awake, alert , oriented and patient cooperative  Airway & Oxygen Therapy: Patient Spontanous Breathing and Patient connected to face mask oxygen  Post-op Assessment: Report given to RN, Post -op Vital signs reviewed and stable and Patient moving all extremities  Post vital signs: Reviewed and stable  Last Vitals:  Vitals Value Taken Time  BP 129/76 11/21/19 0913  Temp    Pulse 88 11/21/19 0914  Resp 17 11/21/19 0914  SpO2 100 % 11/21/19 0914  Vitals shown include unvalidated device data.  Last Pain:  Vitals:   11/21/19 0741  TempSrc: Oral  PainSc: 0-No pain         Complications: No complications documented.

## 2019-11-21 NOTE — Interval H&P Note (Signed)
History and Physical Interval Note:  11/21/2019  Tara Houston  has presented today for surgery, with the diagnosis of N93.9-Abnormal uterine bleeding.  The various methods of treatment have been discussed with the patient and family. After consideration of risks, benefits and other options for treatment, the patient has consented to  Procedure(s): DILATATION & CURETTAGE/HYSTEROSCOPY WITH HYDROTHERMAL ABLATION (N/A) as a surgical intervention.  The patient's history has been reviewed, patient examined, no change in status, stable for surgery.  I have reviewed the patient's chart and labs.  Questions were answered to the patient's satisfaction.     Annalee Genta

## 2019-11-22 ENCOUNTER — Encounter (HOSPITAL_BASED_OUTPATIENT_CLINIC_OR_DEPARTMENT_OTHER): Payer: Self-pay | Admitting: Obstetrics & Gynecology

## 2019-11-22 LAB — SURGICAL PATHOLOGY

## 2019-11-22 NOTE — Addendum Note (Signed)
Addendum  created 11/22/19 0957 by Chanley Mcenery, Ernesta Amble, CRNA   Charge Capture section accepted

## 2020-01-01 ENCOUNTER — Telehealth: Payer: Self-pay | Admitting: Hematology and Oncology

## 2020-01-01 NOTE — Telephone Encounter (Signed)
Received a new pt referral from [facility] at [facility] for anemia Ms. Heidel cld to schedule a new hem appt on 10/8 at 2pm. Pt aware to arrive 30 minutes early.

## 2020-01-14 ENCOUNTER — Ambulatory Visit (INDEPENDENT_AMBULATORY_CARE_PROVIDER_SITE_OTHER): Payer: 59

## 2020-01-14 ENCOUNTER — Other Ambulatory Visit: Payer: Self-pay

## 2020-01-14 ENCOUNTER — Ambulatory Visit (INDEPENDENT_AMBULATORY_CARE_PROVIDER_SITE_OTHER): Payer: 59 | Admitting: Pulmonary Disease

## 2020-01-14 ENCOUNTER — Encounter: Payer: Self-pay | Admitting: Pulmonary Disease

## 2020-01-14 VITALS — BP 120/80 | HR 90 | Temp 98.0°F | Ht 68.0 in | Wt 242.0 lb

## 2020-01-14 DIAGNOSIS — R059 Cough, unspecified: Secondary | ICD-10-CM | POA: Diagnosis not present

## 2020-01-14 LAB — CBC WITH DIFFERENTIAL/PLATELET
Basophils Absolute: 0.1 10*3/uL (ref 0.0–0.1)
Basophils Relative: 0.9 % (ref 0.0–3.0)
Eosinophils Absolute: 0.2 10*3/uL (ref 0.0–0.7)
Eosinophils Relative: 3.1 % (ref 0.0–5.0)
HCT: 30.9 % — ABNORMAL LOW (ref 36.0–46.0)
Hemoglobin: 9.7 g/dL — ABNORMAL LOW (ref 12.0–15.0)
Lymphocytes Relative: 23.5 % (ref 12.0–46.0)
Lymphs Abs: 1.6 10*3/uL (ref 0.7–4.0)
MCHC: 31.3 g/dL (ref 30.0–36.0)
MCV: 78 fl (ref 78.0–100.0)
Monocytes Absolute: 0.6 10*3/uL (ref 0.1–1.0)
Monocytes Relative: 9.3 % (ref 3.0–12.0)
Neutro Abs: 4.2 10*3/uL (ref 1.4–7.7)
Neutrophils Relative %: 63.2 % (ref 43.0–77.0)
Platelets: 590 10*3/uL — ABNORMAL HIGH (ref 150.0–400.0)
RBC: 3.95 Mil/uL (ref 3.87–5.11)
RDW: 24.5 % — ABNORMAL HIGH (ref 11.5–15.5)
WBC: 6.6 10*3/uL (ref 4.0–10.5)

## 2020-01-14 MED ORDER — PREDNISONE 10 MG PO TABS
40.0000 mg | ORAL_TABLET | Freq: Every day | ORAL | 0 refills | Status: AC
Start: 2020-01-14 — End: 2020-01-19

## 2020-01-14 MED ORDER — BUDESONIDE-FORMOTEROL FUMARATE 160-4.5 MCG/ACT IN AERO: 2.0000 | INHALATION_SPRAY | Freq: Two times a day (BID) | RESPIRATORY_TRACT | 6 refills | Status: AC

## 2020-01-14 NOTE — Progress Notes (Signed)
Tara Houston    [id number]    [birth year]  Primary Care Physician:Pcp, No  Referring Physician: No referring provider defined for this encounter.  Chief complaint: Consult for cough  HPI: 49 year old with history of childhood asthma Diagnosed with COVID-19 in January 2021.  She had a mild case and did not require hospitalization  Around July 2021 she had an episode of bronchitis with persistent coughing spells, shortness of breath since then.  Cough is nonproductive in nature, no fevers or chills or wheezing.  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  No mold, hot tub, Jacuzzi.  She has feather pillows but keeps them in cupboard and does not use them Smoking history: Never smoker Travel history: No significant travel history Relevant family history: No significant family history of lung disease   Outpatient Encounter Medications as of 01/14/2020  Medication Sig  . Acetaminophen (TYLENOL PO) Take by mouth.  . ferrous sulfate 324 MG TBEC Take 324 mg by mouth.  . Multiple Vitamin (MULTIVITAMIN WITH MINERALS) TABS tablet Take 1 tablet by mouth daily.  . Ascorbic Acid (VITAMIN C) 1000 MG tablet Take 1,000 mg by mouth daily.  . cyanocobalamin 1000 MCG tablet Take 1,000 mcg by mouth daily.  . [DISCONTINUED] Vitamin E 500 UNIT/GM POWD by Does not apply route.   No facility-administered encounter medications on file as of 01/14/2020.    Allergies as of 01/14/2020  . (No Known Allergies)    Past Medical History:  Diagnosis Date  . Asthma    exercise induced, as a young adult.  Marland Kitchen PONV (postoperative nausea and vomiting)     Past Surgical History:  Procedure Laterality Date  . CYST EXCISION     removed from leg  . DILITATION &  CURRETTAGE/HYSTROSCOPY WITH HYDROTHERMAL ABLATION N/A 11/21/2019   Procedure: DILATATION & CURETTAGE/HYSTEROSCOPY WITH HYDROTHERMAL ABLATION;  Surgeon: Janyth Pupa, DO;  Location: [facility];  Service: Gynecology;  Laterality: N/A;  . TUBAL LIGATION    . VARICOSE VEIN SURGERY      Family History  Problem Relation Age of Onset  . Breast cancer Mother 26    Social History   Socioeconomic History  . Marital status: Married    Spouse name: Pieter Partridge  . Number of children: Not on file  . Years of education: Not on file  . Highest education level: Not on file  Occupational History  . Not on file  Tobacco Use  . Smoking status: Never Smoker  . Smokeless tobacco: Never Used  Substance and Sexual Activity  . Alcohol use: Yes    Comment: rare  . Drug use: Never  . Sexual activity: Not on file  Other Topics Concern  . Not on file  Social History Narrative  . Not on file   Social Determinants of Health   Financial Resource Strain:   . Difficulty of Paying Living Expenses: Not on file  Food Insecurity:   . Worried About Charity fundraiser in the Last Year: Not on file  . Ran Out of Food in the Last Year: Not on file  Transportation Needs:   . Lack of Transportation (Medical): Not on file  . Lack of Transportation (Non-Medical): Not on file  Physical  Activity:   . Days of Exercise per Week: Not on file  . Minutes of Exercise per Session: Not on file  Stress:   . Feeling of Stress : Not on file  Social Connections:   . Frequency of Communication with Friends and Family: Not on file  . Frequency of Social Gatherings with Friends and Family: Not on file  . Attends Religious Services: Not on file  . Active Member of Clubs or Organizations: Not on file  . Attends Archivist Meetings: Not on file  . Marital Status: Not on file  Intimate Partner Violence:   . Fear of Current or Ex-Partner: Not on file  . Emotionally Abused: Not on file  . Physically  Abused: Not on file  . Sexually Abused: Not on file    Review of systems: Review of Systems  Constitutional: Negative for fever and chills.  HENT: Negative.   Eyes: Negative for blurred vision.  Respiratory: as per HPI  Cardiovascular: Negative for chest pain and palpitations.  Gastrointestinal: Negative for vomiting, diarrhea, blood per rectum. Genitourinary: Negative for dysuria, urgency, frequency and hematuria.  Musculoskeletal: Negative for myalgias, back pain and joint pain.  Skin: Negative for itching and rash.  Neurological: Negative for dizziness, tremors, focal weakness, seizures and loss of consciousness.  Endo/Heme/Allergies: Negative for environmental allergies.  Psychiatric/Behavioral: Negative for depression, suicidal ideas and hallucinations.  All other systems reviewed and are negative.  Physical Exam: Blood pressure 120/80, pulse 90, temperature 98 F (36.7 C), temperature source Temporal, height 5\' 8"  (1.727 m), weight 242 lb (109.8 kg), SpO2 100 %. Gen:      No acute distress HEENT:  EOMI, sclera anicteric Neck:     No masses; no thyromegaly Lungs:    Clear to auscultation bilaterally; normal respiratory effort CV:         Regular rate and rhythm; no murmurs Abd:      + bowel sounds; soft, non-tender; no palpable masses, no distension Ext:    No edema; adequate peripheral perfusion Skin:      Warm and dry; no rash Neuro: alert and oriented x 3 Psych: normal mood and affect  Data Reviewed: Imaging:  PFTs:  Labs:  Assessment:  Cough, dyspnea secondary to asthmatic bronchitis We will get chest x-ray today and CBC differential, IgE Schedule pulmonary function test  Treat with prednisone 40 mg a day for 5 days Trial of Symbicort 160 inhaler  Plan/Recommendations: Chest x-ray, CBC, IgE PFTs Prednisone for 5 days Symbicort   Marshell Garfinkel MD [facility] 01/14/2020, 9:43 AM  CC: No ref. provider found

## 2020-01-14 NOTE — Patient Instructions (Signed)
Check labs today including CBC with differential, IgE Get chest x-ray and schedule pulmonary function test  Prednisone 40 mg a day for 5 days Start Symbicort 160 Follow-up in 1 to 2 months.

## 2020-01-15 LAB — IGE: IgE (Immunoglobulin E), Serum: 75 kU/L (ref <=114)

## 2020-01-15 NOTE — Telephone Encounter (Signed)
Dr. Mannam - please advise. Thanks. 

## 2020-01-16 NOTE — Telephone Encounter (Signed)
Changes seen on chest x-ray are very minimal and likely not significant Given her history of Covid infection we can get a high-resolution CT chest for further evaluation  Also let her know that labs done earlier this week are stable. Her hemoglobin is low at 9.7 but improved from 1 month ago when it was 8

## 2020-01-18 ENCOUNTER — Other Ambulatory Visit: Payer: Self-pay

## 2020-01-18 ENCOUNTER — Inpatient Hospital Stay: Payer: 59

## 2020-01-18 ENCOUNTER — Inpatient Hospital Stay: Payer: 59 | Attending: Hematology and Oncology | Admitting: Hematology and Oncology

## 2020-01-18 VITALS — BP 155/90 | HR 81 | Temp 97.8°F | Resp 20 | Ht 68.0 in | Wt 241.5 lb

## 2020-01-18 DIAGNOSIS — Z23 Encounter for immunization: Secondary | ICD-10-CM | POA: Diagnosis not present

## 2020-01-18 DIAGNOSIS — N920 Excessive and frequent menstruation with regular cycle: Secondary | ICD-10-CM

## 2020-01-18 DIAGNOSIS — Z8616 Personal history of COVID-19: Secondary | ICD-10-CM

## 2020-01-18 DIAGNOSIS — Z803 Family history of malignant neoplasm of breast: Secondary | ICD-10-CM

## 2020-01-18 DIAGNOSIS — D500 Iron deficiency anemia secondary to blood loss (chronic): Secondary | ICD-10-CM | POA: Diagnosis not present

## 2020-01-18 DIAGNOSIS — J45909 Unspecified asthma, uncomplicated: Secondary | ICD-10-CM

## 2020-01-18 DIAGNOSIS — Z79899 Other long term (current) drug therapy: Secondary | ICD-10-CM | POA: Diagnosis not present

## 2020-01-18 MED ORDER — INFLUENZA VAC SPLIT QUAD 0.5 ML IM SUSY
PREFILLED_SYRINGE | INTRAMUSCULAR | Status: AC
  Filled 2020-01-18: qty 0.5

## 2020-01-18 MED ORDER — INFLUENZA VAC SPLIT QUAD 0.5 ML IM SUSY
0.5000 mL | PREFILLED_SYRINGE | Freq: Once | INTRAMUSCULAR | Status: AC
  Administered 2020-01-18: 0.5 mL via INTRAMUSCULAR

## 2020-01-18 NOTE — Patient Instructions (Signed)
Pt received flu vaccine and was provided with vaccine information

## 2020-01-18 NOTE — Progress Notes (Signed)
[facility] [phone]   Fax:([phone]) [place] NOTE  Patient Care Team: Pcp, No as PCP - General  Hematological/Oncological History # Iron Deficiency Anemia 2/2 GYN Bleeding 1) 11/20/2019: WBC 9.9, Hgb 8.0, MCV 78.6, Plt 535 2) 12/18/2019: WBC 5.2, Hgb 8.4, MCV 71, Plt 572. Iron 17, Iron Sat 4% 3) 01/14/2020: WBC 6.6, Hgb 9.7, MCV 78, Plt 590 4) 01/18/2020: establish care with Dr. Lorenso Courier   CHIEF COMPLAINTS/PURPOSE OF CONSULTATION:  "Iron Deficiency Anemia "  HISTORY OF PRESENTING ILLNESS:  Tara Houston 49 y.o. female with medical history significant for asthma who presents for evaluation of a microcytic anemia.   On review of the previous records Tara Houston has a known history of iron deficiency anemia.  This is secondary to heavy menstrual bleeding due to fibroids.  She had a CBC drawn on 11/20/2019 the day before her ablation procedure and was found to have a white blood cell count 9.9, hemoglobin 8.0, MCV of 78.6, and a platelet count of 535.  She was reportedly started on p.o. iron therapy approximately 3 weeks ago and had a CBC drawn on 12/18/2019 with a white blood cell count of 5.2, hemoglobin of 8.4, MCV of 71, and a platelet count of 572.  At that time her iron was noted to be 17, and iron sat 4%.  On 01/14/2020 the patient was found to have white blood cell count 6.6, hemoglobin 9.7, MCV of 78, and a platelet count of 590.  The patient presents today for further evaluation and management regarding her iron deficiency anemia.  On exam today Tara Houston notes that this has been "a crazy year".  She notes that she was diagnosed with Covid in January 2021.  She notes that she has had difficulty with a long lasting episode of bronchitis for which she is currently being followed by pulmonology.  She notes that she underwent her endometrial ablation on 11/21/2019 without any difficulty.  Prior to her endometrial ablation the patient notes that she was  having heavy menstrual cycles that would last typically 5 days and occur on regular 28-day intervals.  She notes on her heaviest days she could soak 1 pad per hour.  She also had an episode in Tara Houston 2021 where she had 3 weeks of continuous straight bleeding.  She thought that she might "bleed to death".  She has been taking her iron pills for approximately 3 weeks time and notes that she has been tolerating it well without any constipation, nausea, or stomach upset.  She notes that when she for started having heavy menstrual bleeding she was taking p.o. iron pills but it caused severe constipation and flaring of a hemorrhoid.  On further discussion she notes that she is not having any issues with fevers, chills, sweats, nausea, vomiting or diarrhea.  She has that her energy levels are improved and she is only having shortness of breath as result of her bronchitis which is currently improving with pulmonary treatment.  She is a non-smoker and has no medical history markable for blood disorders.  A full 10 point ROS is listed below.  MEDICAL HISTORY:  Past Medical History:  Diagnosis Date  . Asthma    exercise induced, as a young adult.  Marland Kitchen PONV (postoperative nausea and vomiting)     SURGICAL HISTORY: Past Surgical History:  Procedure Laterality Date  . CYST EXCISION     removed from leg  . DILITATION & CURRETTAGE/HYSTROSCOPY WITH HYDROTHERMAL ABLATION N/A 11/21/2019  Procedure: DILATATION & CURETTAGE/HYSTEROSCOPY WITH HYDROTHERMAL ABLATION;  Surgeon: Janyth Pupa, DO;  Location: [facility];  Service: Gynecology;  Laterality: N/A;  . TUBAL LIGATION    . VARICOSE VEIN SURGERY      SOCIAL HISTORY: Social History   Socioeconomic History  . Marital status: Married    Spouse name: Tara Houston  . Number of children: Not on file  . Years of education: Not on file  . Highest education level: Not on file  Occupational History  . Not on file  Tobacco Use  . Smoking status: Never  Smoker  . Smokeless tobacco: Never Used  Substance and Sexual Activity  . Alcohol use: Yes    Comment: rare  . Drug use: Never  . Sexual activity: Not on file  Other Topics Concern  . Not on file  Social History Narrative  . Not on file   Social Determinants of Health   Financial Resource Strain:   . Difficulty of Paying Living Expenses: Not on file  Food Insecurity:   . Worried About Charity fundraiser in the Last Year: Not on file  . Ran Out of Food in the Last Year: Not on file  Transportation Needs:   . Lack of Transportation (Medical): Not on file  . Lack of Transportation (Non-Medical): Not on file  Physical Activity:   . Days of Exercise per Week: Not on file  . Minutes of Exercise per Session: Not on file  Stress:   . Feeling of Stress : Not on file  Social Connections:   . Frequency of Communication with Friends and Family: Not on file  . Frequency of Social Gatherings with Friends and Family: Not on file  . Attends Religious Services: Not on file  . Active Member of Clubs or Organizations: Not on file  . Attends Archivist Meetings: Not on file  . Marital Status: Not on file  Intimate Partner Violence:   . Fear of Current or Ex-Partner: Not on file  . Emotionally Abused: Not on file  . Physically Abused: Not on file  . Sexually Abused: Not on file    FAMILY HISTORY: Family History  Problem Relation Age of Onset  . Breast cancer Mother 35    ALLERGIES:  is allergic to pollen extract.  MEDICATIONS:  Current Outpatient Medications  Medication Sig Dispense Refill  . Acetaminophen (TYLENOL PO) Take by mouth.    . budesonide-formoterol (SYMBICORT) 160-4.5 MCG/ACT inhaler Inhale 2 puffs into the lungs 2 (two) times daily. 1 each 6  . ferrous sulfate 324 MG TBEC Take 324 mg by mouth.    . Multiple Vitamin (MULTIVITAMIN WITH MINERALS) TABS tablet Take 1 tablet by mouth daily.    . predniSONE (DELTASONE) 10 MG tablet Take 4 tablets (40 mg total) by  mouth daily with breakfast for 5 days. 20 tablet 0   No current facility-administered medications for this visit.    REVIEW OF SYSTEMS:   Constitutional: ( - ) fevers, ( - )  chills , ( - ) night sweats Eyes: ( - ) blurriness of vision, ( - ) double vision, ( - ) watery eyes Ears, nose, mouth, throat, and face: ( - ) mucositis, ( - ) sore throat Respiratory: ( - ) cough, ( - ) dyspnea, ( - ) wheezes Cardiovascular: ( - ) palpitation, ( - ) chest discomfort, ( - ) lower extremity swelling Gastrointestinal:  ( - ) nausea, ( - ) heartburn, ( - ) change in  bowel habits Skin: ( - ) abnormal skin rashes Lymphatics: ( - ) new lymphadenopathy, ( - ) easy bruising Neurological: ( - ) numbness, ( - ) tingling, ( - ) new weaknesses Behavioral/Psych: ( - ) mood change, ( - ) new changes  All other systems were reviewed with the patient and are negative.  PHYSICAL EXAMINATION: ECOG PERFORMANCE STATUS: 0 - Asymptomatic  Vitals:   01/18/20 1353  BP: (!) 155/90  Pulse: 81  Resp: 20  Temp: 97.8 F (36.6 C)  SpO2: 97%   Filed Weights   01/18/20 1353  Weight: 241 lb 8 oz (109.5 kg)    GENERAL: well appearing middle aged Caucasian female in NAD  SKIN: skin color, texture, turgor are normal, no rashes or significant lesions EYES: conjunctiva are pink and non-injected, sclera clear LUNGS: clear to auscultation and percussion with normal breathing effort HEART: regular rate & rhythm and no murmurs and no lower extremity edema Musculoskeletal: no cyanosis of digits and no clubbing  PSYCH: alert & oriented x 3, fluent speech NEURO: no focal motor/sensory deficits  LABORATORY DATA:  I have reviewed the data as listed CBC Latest Ref Rng & Units 01/14/2020 11/20/2019  WBC 4.0 - 10.5 K/uL 6.6 9.9  Hemoglobin 12.0 - 15.0 g/dL 9.7(L) 8.0(L)  Hematocrit 36 - 46 % 30.9(L) 27.6(L)  Platelets 150 - 400 K/uL 590.0(H) 535(H)    CMP Latest Ref Rng & Units 11/20/2019  Glucose 70 - 99 mg/dL 94  BUN 6 -  20 mg/dL 6  Creatinine 0.44 - 1.00 mg/dL 0.61  Sodium 135 - 145 mmol/L 138  Potassium 3.5 - 5.1 mmol/L 3.7  Chloride 98 - 111 mmol/L 103  CO2 22 - 32 mmol/L 25  Calcium 8.9 - 10.3 mg/dL 9.2  Total Protein 6.5 - 8.1 g/dL 6.9  Total Bilirubin 0.3 - 1.2 mg/dL 0.4  Alkaline Phos 38 - 126 U/L 57  AST 15 - 41 U/L 15  ALT 0 - 44 U/L 14     RADIOGRAPHIC STUDIES: DG Chest 2 View  Result Date: 01/14/2020 CLINICAL DATA:  Chronic cough. EXAM: CHEST - 2 VIEW COMPARISON:  None. FINDINGS: Blunting at the right costophrenic angle is compatible with a small effusion or scar. No consolidative process or pneumothorax. No pleural effusion on the left. Heart size is normal. No acute focal bony abnormality. IMPRESSION: Small right pleural effusion versus pleural scar. Electronically Signed   By: Inge Rise M.D.   On: 01/14/2020 15:10    ASSESSMENT & PLAN Reed Olena Willy 49 y.o. female with medical history significant for asthma who presents for evaluation of a microcytic anemia.  After review the labs, review the records, discussion with the patient the findings are most consistent with an iron deficiency anemia secondary to GYN bleeding.  Fortunately the patient is undergone endometrial ablation and therefore has stanched the source of the bleeding.  She is now taking p.o. ferrous sulfate 325 mg p.o. daily with a source of vitamin C and tolerating it well.  Her hemoglobin has increased from 8.0 the day before the surgery to 9.7 up approximately 4 days ago.  Given her rapid rise in hemoglobin with p.o. iron therapy I believe that she is being adequately treated.  This combined with the outside iron labs which show that she was iron deficient imply that there is no further lab work required at this time in order to prove the diagnosis.  I would recommend that she continue this p.o. therapy and will have  her return to clinic in approximately 3 months time to assure that her iron stores replete and her  hemoglobin has normalized.  # Iron Deficiency Anemia 2/2 GYN Bleeding --continue PO ferrous sulfate 325mg  PO daily with a source of Vitamin C. Avoid with calcium or antacid products --last labs drawn on 01/14/2020 show increase in the Hgb. No need to repeat labs today --as long as patient can tolerate PO therapy will continue with this plan. If PO iron therapy becomes an issue we can consider IV treatment.  --plan for return visit in 3 months to assure increase in the Hgb and stable stores of iron.   No orders of the defined types were placed in this encounter.   All questions were answered. The patient knows to call the clinic with any problems, questions or concerns.  A total of more than 40 minutes were spent on this encounter and over half of that time was spent on counseling and coordination of care as outlined above.   Ledell Peoples, MD Department of Hematology/Oncology [facility] Phone: [phone] Pager: [phone] Email: [email or web address]  01/18/2020 2:52 PM

## 2020-01-23 ENCOUNTER — Encounter (HOSPITAL_BASED_OUTPATIENT_CLINIC_OR_DEPARTMENT_OTHER): Payer: Self-pay

## 2020-01-23 ENCOUNTER — Telehealth: Payer: Self-pay | Admitting: Hematology and Oncology

## 2020-01-23 ENCOUNTER — Other Ambulatory Visit: Payer: Self-pay

## 2020-01-23 ENCOUNTER — Emergency Department (HOSPITAL_BASED_OUTPATIENT_CLINIC_OR_DEPARTMENT_OTHER)
Admission: EM | Admit: 2020-01-23 | Discharge: 2020-01-23 | Disposition: A | Discharge status: Home / Self Care | Payer: 59 | Attending: Emergency Medicine | Admitting: Emergency Medicine

## 2020-01-23 DIAGNOSIS — J45909 Unspecified asthma, uncomplicated: Secondary | ICD-10-CM | POA: Diagnosis not present

## 2020-01-23 DIAGNOSIS — K2900 Acute gastritis without bleeding: Secondary | ICD-10-CM | POA: Insufficient documentation

## 2020-01-23 DIAGNOSIS — Z79899 Other long term (current) drug therapy: Secondary | ICD-10-CM | POA: Insufficient documentation

## 2020-01-23 DIAGNOSIS — R1013 Epigastric pain: Secondary | ICD-10-CM | POA: Diagnosis present

## 2020-01-23 HISTORY — DX: Bronchitis, not specified as acute or chronic: J40

## 2020-01-23 HISTORY — DX: Iron deficiency: E61.1

## 2020-01-23 LAB — COMPREHENSIVE METABOLIC PANEL
ALT: 32 U/L (ref 0–44)
AST: 18 U/L (ref 15–41)
Albumin: 3.7 g/dL (ref 3.5–5.0)
Alkaline Phosphatase: 59 U/L (ref 38–126)
Anion gap: 7 (ref 5–15)
BUN: 6 mg/dL (ref 6–20)
CO2: 27 mmol/L (ref 22–32)
Calcium: 8.9 mg/dL (ref 8.9–10.3)
Chloride: 102 mmol/L (ref 98–111)
Creatinine, Ser: 0.68 mg/dL (ref 0.44–1.00)
GFR, Estimated: >60 mL/min (ref >60)
Glucose, Bld: 118 mg/dL — ABNORMAL HIGH (ref 70–99)
Potassium: 3.8 mmol/L (ref 3.5–5.1)
Sodium: 136 mmol/L (ref 135–145)
Total Bilirubin: 0.8 mg/dL (ref 0.3–1.2)
Total Protein: 7.1 g/dL (ref 6.5–8.1)

## 2020-01-23 LAB — URINALYSIS, ROUTINE W REFLEX MICROSCOPIC
Bilirubin Urine: NEGATIVE
Glucose, UA: NEGATIVE mg/dL
Ketones, ur: NEGATIVE mg/dL
Leukocytes,Ua: NEGATIVE
Nitrite: NEGATIVE
Protein, ur: NEGATIVE mg/dL
Specific Gravity, Urine: <1.005 — ABNORMAL LOW (ref 1.005–1.030)
pH: 6.5 (ref 5.0–8.0)

## 2020-01-23 LAB — LIPASE, BLOOD: Lipase: 26 U/L (ref 11–51)

## 2020-01-23 LAB — PREGNANCY, URINE: Preg Test, Ur: NEGATIVE

## 2020-01-23 LAB — CBC
HCT: 35 % — ABNORMAL LOW (ref 36.0–46.0)
Hemoglobin: 10.6 g/dL — ABNORMAL LOW (ref 12.0–15.0)
MCH: 24.5 pg — ABNORMAL LOW (ref 26.0–34.0)
MCHC: 30.3 g/dL (ref 30.0–36.0)
MCV: 81 fL (ref 80.0–100.0)
Platelets: 463 10*3/uL — ABNORMAL HIGH (ref 150–400)
RBC: 4.32 MIL/uL (ref 3.87–5.11)
RDW: 21.2 % — ABNORMAL HIGH (ref 11.5–15.5)
WBC: 11.7 10*3/uL — ABNORMAL HIGH (ref 4.0–10.5)
nRBC: 0 % (ref 0.0–0.2)

## 2020-01-23 LAB — URINALYSIS, MICROSCOPIC (REFLEX)

## 2020-01-23 MED ORDER — ALUM & MAG HYDROXIDE-SIMETH 200-200-20 MG/5ML PO SUSP
30.0000 mL | Freq: Once | ORAL | Status: AC
  Administered 2020-01-23: 30 mL via ORAL
  Filled 2020-01-23: qty 30

## 2020-01-23 MED ORDER — FAMOTIDINE IN NACL 20-0.9 MG/50ML-% IV SOLN
20.0000 mg | Freq: Once | INTRAVENOUS | Status: AC
  Administered 2020-01-23: 20 mg via INTRAVENOUS
  Filled 2020-01-23: qty 50

## 2020-01-23 MED ORDER — LIDOCAINE VISCOUS HCL 2 % MT SOLN
15.0000 mL | Freq: Once | OROMUCOSAL | Status: AC
  Administered 2020-01-23: 15 mL via ORAL
  Filled 2020-01-23: qty 15

## 2020-01-23 MED ORDER — DICYCLOMINE HCL 10 MG PO CAPS
10.0000 mg | ORAL_CAPSULE | Freq: Once | ORAL | Status: AC
  Administered 2020-01-23: 10 mg via ORAL
  Filled 2020-01-23: qty 1

## 2020-01-23 MED ORDER — DICYCLOMINE HCL 20 MG PO TABS
20.0000 mg | ORAL_TABLET | Freq: Three times a day (TID) | ORAL | 0 refills | Status: AC
Start: 2020-01-23 — End: ?

## 2020-01-23 NOTE — Telephone Encounter (Signed)
Scheduled per los. Called and left msg. Mailed printout  °

## 2020-01-23 NOTE — ED Triage Notes (Signed)
Pt c/o abd pain x 3 days-denies n//vd-NAD-steady gait

## 2020-01-23 NOTE — Discharge Instructions (Signed)
Continue to take famotidine.  Avoid ibuprofen.  Take the bentyl as needed for spasm and if you develop severe pain, vomiting, fever or other concerns return to the ER.

## 2020-01-23 NOTE — ED Provider Notes (Signed)
[facility] EMERGENCY DEPARTMENT Provider Note   CSN: [id number] Arrival date & time: 01/23/20  1957     History Chief Complaint  Patient presents with  . Abdominal Pain    Tara Houston is a 49 y.o. female.  The history is provided by the patient.  Abdominal Pain Pain location:  Epigastric and RUQ Pain quality: cramping and gnawing   Pain radiates to:  Does not radiate Pain severity:  Moderate Onset quality:  Gradual Duration:  4 days Timing:  Constant Progression:  Worsening Chronicity:  New Context comment:  Patient does report that she had bronchitis for a while and saw pulmonology last week and was started on prednisone. However she went to a [employer] on Sunday and the first plate of food did not taste right and since that time she's had pain  Relieved by: better if she lays on her right side. Worsened by:  Nothing Ineffective treatments:  Antacids and eating Associated symptoms: no anorexia, no cough, no diarrhea, no dysuria, no fever, no nausea, no shortness of breath and no vomiting   Risk factors comment:  Hx of bronchitis/asthma, anemia      Past Medical History:  Diagnosis Date  . Asthma    exercise induced, as a young adult.  . Bronchitis   . Low iron   . PONV (postoperative nausea and vomiting)     There are no problems to display for this patient.   Past Surgical History:  Procedure Laterality Date  . CYST EXCISION     removed from leg  . DILITATION & CURRETTAGE/HYSTROSCOPY WITH HYDROTHERMAL ABLATION N/A 11/21/2019   Procedure: DILATATION & CURETTAGE/HYSTEROSCOPY WITH HYDROTHERMAL ABLATION;  Surgeon: Janyth Pupa, DO;  Location: [facility];  Service: Gynecology;  Laterality: N/A;  . TUBAL LIGATION    . VARICOSE VEIN SURGERY       OB History   No obstetric history on file.     Family History  Problem Relation Age of Onset  . Breast cancer Mother 71    Social History   Tobacco Use  . Smoking  status: Never Smoker  . Smokeless tobacco: Never Used  Vaping Use  . Vaping Use: Never used  Substance Use Topics  . Alcohol use: Yes    Comment: occ  . Drug use: Never    Home Medications Prior to Admission medications   Medication Sig Start Date End Date Taking? Authorizing Provider  Acetaminophen (TYLENOL PO) Take by mouth.    [provider]  budesonide-formoterol (SYMBICORT) 160-4.5 MCG/ACT inhaler Inhale 2 puffs into the lungs 2 (two) times daily. 01/14/20   Mannam, Hart Robinsons, MD  ferrous sulfate 324 MG TBEC Take 324 mg by mouth.    [provider]  Multiple Vitamin (MULTIVITAMIN WITH MINERALS) TABS tablet Take 1 tablet by mouth daily.    [provider]    Allergies    Pollen extract  Review of Systems   Review of Systems  Constitutional: Negative for fever.  Respiratory: Negative for cough and shortness of breath.   Gastrointestinal: Positive for abdominal pain. Negative for anorexia, diarrhea, nausea and vomiting.  Genitourinary: Negative for dysuria.  All other systems reviewed and are negative.   Physical Exam Updated Vital Signs BP (!) 142/89   Pulse 86   Temp 99.2 F (37.3 C) (Oral)   Resp 16   Ht 5\' 8"  (1.727 m)   Wt 109.8 kg   SpO2 100%   BMI 36.80 kg/m   Physical  Exam Vitals and nursing note reviewed.  Constitutional:      General: She is not in acute distress.    Appearance: She is well-developed. She is obese.  HENT:     Head: Normocephalic and atraumatic.  Eyes:     Pupils: Pupils are equal, round, and reactive to light.  Cardiovascular:     Rate and Rhythm: Normal rate and regular rhythm.     Heart sounds: Normal heart sounds. No murmur heard.  No friction rub.  Pulmonary:     Effort: Pulmonary effort is normal.     Breath sounds: Normal breath sounds. No wheezing or rales.  Abdominal:     General: Bowel sounds are normal. There is no distension.     Palpations: Abdomen is soft.     Tenderness: There is  abdominal tenderness in the right upper quadrant and epigastric area. There is no right CVA tenderness, left CVA tenderness, guarding or rebound. Negative signs include Murphy's sign.  Musculoskeletal:        General: No tenderness. Normal range of motion.     Comments: No edema  Skin:    General: Skin is warm and dry.     Findings: No rash.  Neurological:     Mental Status: She is alert and oriented to person, place, and time.     Cranial Nerves: No cranial nerve deficit.  Psychiatric:        Mood and Affect: Mood normal.        Behavior: Behavior normal.        Thought Content: Thought content normal.     ED Results / Procedures / Treatments   Labs (all labs ordered are listed, but only abnormal results are displayed) Labs Reviewed  COMPREHENSIVE METABOLIC PANEL - Abnormal; Notable for the following components:      Result Value   Glucose, Bld 118 (*)    All other components within normal limits  CBC - Abnormal; Notable for the following components:   WBC 11.7 (*)    Hemoglobin 10.6 (*)    HCT 35.0 (*)    MCH 24.5 (*)    RDW 21.2 (*)    Platelets 463 (*)    All other components within normal limits  URINALYSIS, ROUTINE W REFLEX MICROSCOPIC - Abnormal; Notable for the following components:   Specific Gravity, Urine <1.005 (*)    Hgb urine dipstick SMALL (*)    All other components within normal limits  URINALYSIS, MICROSCOPIC (REFLEX) - Abnormal; Notable for the following components:   Bacteria, UA FEW (*)    All other components within normal limits  LIPASE, BLOOD  PREGNANCY, URINE    EKG None  Radiology No results found.  Procedures Procedures (including critical care time)  Medications Ordered in ED Medications  alum & mag hydroxide-simeth (MAALOX/MYLANTA) 200-200-20 MG/5ML suspension 30 mL (30 mLs Oral Given 01/23/20 2148)    And  lidocaine (XYLOCAINE) 2 % viscous mouth solution 15 mL (15 mLs Oral Given 01/23/20 2149)  dicyclomine (BENTYL) capsule 10 mg  (10 mg Oral Given 01/23/20 2148)  famotidine (PEPCID) IVPB 20 mg premix (0 mg Intravenous Stopped 01/23/20 2228)    ED Course  I have reviewed the triage vital signs and the nursing notes.  Pertinent labs & imaging results that were available during my care of the patient were reviewed by me and considered in my medical decision making (see chart for details).    MDM Rules/Calculators/A&P  Patient is a 49 year old female presenting today with epigastric abdominal pain. Patient reports the symptoms started approximately 4 days ago after eating what she thought was lukewarm bad food in a [employer]. However she has had no nausea or vomiting and has had no change in her bowel movements. She cannot say that eating makes the pain significantly worse. She does report recently starting prednisone for bronchitis type symptoms several weeks ago and prior to starting the prednisone have been using ibuprofen fairly regularly so that she can get some rest. However she does not usually use NSAIDs or aspirin products. She has not felt a lot of heartburn symptoms but did see someone today who felt that she may have gastritis. She was given famotidine which she took 1 dose of but felt like it made her symptoms worse so came here for further evaluation. Patient's CMP, lipase are within normal limits, CBC with minimal leukocytosis of 11,000 but stable hemoglobin. No elevation of LFTs or T bili. On exam patient has mostly epigastric pain but some mild right upper quadrant tenderness. There is no Murphy sign and lower suspicion for cholecystitis at this time. Suspect most likely gastritis related to recent NSAIDs and prednisone as well as possible food exposure. Patient does not have symptoms today concerning for appendicitis, diverticulitis, obstruction or perforation. She was given GI cocktail, Pepcid and Bentyl. On reevaluation  Pain is improved and pt d/ced home with supportive care and  return precations.  MDM Number of Diagnoses or Management Options   Amount and/or Complexity of Data Reviewed Clinical lab tests: ordered and reviewed Independent visualization of images, tracings, or specimens: yes  Risk of Complications, Morbidity, and/or Mortality Presenting problems: moderate Diagnostic procedures: low Management options: low  Patient Progress Patient progress: improved   Final Clinical Impression(s) / ED Diagnoses Final diagnoses:  Acute gastritis without hemorrhage, unspecified gastritis type    Rx / DC Orders ED Discharge Orders         Ordered    dicyclomine (BENTYL) 20 MG tablet  3 times daily before meals & bedtime        01/23/20 2323           Blanchie Dessert, MD 01/23/20 2348

## 2020-01-27 ENCOUNTER — Encounter (HOSPITAL_COMMUNITY): Payer: Self-pay | Admitting: Emergency Medicine

## 2020-01-27 ENCOUNTER — Emergency Department (HOSPITAL_COMMUNITY)
Admission: EM | Admit: 2020-01-27 | Discharge: 2020-01-27 | Disposition: A | Discharge status: Home / Self Care | Payer: 59 | Attending: Emergency Medicine | Admitting: Emergency Medicine

## 2020-01-27 ENCOUNTER — Other Ambulatory Visit: Payer: Self-pay

## 2020-01-27 DIAGNOSIS — R1013 Epigastric pain: Secondary | ICD-10-CM | POA: Insufficient documentation

## 2020-01-27 DIAGNOSIS — J45909 Unspecified asthma, uncomplicated: Secondary | ICD-10-CM | POA: Insufficient documentation

## 2020-01-27 LAB — COMPREHENSIVE METABOLIC PANEL
ALT: 22 U/L (ref 0–44)
AST: 16 U/L (ref 15–41)
Albumin: 3.5 g/dL (ref 3.5–5.0)
Alkaline Phosphatase: 66 U/L (ref 38–126)
Anion gap: 11 (ref 5–15)
BUN: 5 mg/dL — ABNORMAL LOW (ref 6–20)
CO2: 25 mmol/L (ref 22–32)
Calcium: 9.2 mg/dL (ref 8.9–10.3)
Chloride: 99 mmol/L (ref 98–111)
Creatinine, Ser: 0.71 mg/dL (ref 0.44–1.00)
GFR, Estimated: >60 mL/min (ref >60)
Glucose, Bld: 106 mg/dL — ABNORMAL HIGH (ref 70–99)
Potassium: 3.4 mmol/L — ABNORMAL LOW (ref 3.5–5.1)
Sodium: 135 mmol/L (ref 135–145)
Total Bilirubin: 1 mg/dL (ref 0.3–1.2)
Total Protein: 6.9 g/dL (ref 6.5–8.1)

## 2020-01-27 LAB — CBC
HCT: 35.1 % — ABNORMAL LOW (ref 36.0–46.0)
Hemoglobin: 10.4 g/dL — ABNORMAL LOW (ref 12.0–15.0)
MCH: 24.4 pg — ABNORMAL LOW (ref 26.0–34.0)
MCHC: 29.6 g/dL — ABNORMAL LOW (ref 30.0–36.0)
MCV: 82.2 fL (ref 80.0–100.0)
Platelets: 524 10*3/uL — ABNORMAL HIGH (ref 150–400)
RBC: 4.27 MIL/uL (ref 3.87–5.11)
RDW: 20.4 % — ABNORMAL HIGH (ref 11.5–15.5)
WBC: 12.1 10*3/uL — ABNORMAL HIGH (ref 4.0–10.5)
nRBC: 0 % (ref 0.0–0.2)

## 2020-01-27 LAB — I-STAT BETA HCG BLOOD, ED (MC, WL, AP ONLY): I-stat hCG, quantitative: <5 m[IU]/mL (ref <5)

## 2020-01-27 LAB — LIPASE, BLOOD: Lipase: 24 U/L (ref 11–51)

## 2020-01-27 MED ORDER — ALUM & MAG HYDROXIDE-SIMETH 200-200-20 MG/5ML PO SUSP
30.0000 mL | Freq: Once | ORAL | Status: AC
  Administered 2020-01-27: 30 mL via ORAL
  Filled 2020-01-27: qty 30

## 2020-01-27 MED ORDER — LIDOCAINE VISCOUS HCL 2 % MT SOLN
15.0000 mL | Freq: Once | OROMUCOSAL | Status: AC
  Administered 2020-01-27: 15 mL via ORAL
  Filled 2020-01-27: qty 15

## 2020-01-27 MED ORDER — FAMOTIDINE 20 MG PO TABS
40.0000 mg | ORAL_TABLET | Freq: Once | ORAL | Status: AC
  Administered 2020-01-27: 40 mg via ORAL
  Filled 2020-01-27: qty 2

## 2020-01-27 NOTE — ED Provider Notes (Signed)
[facility] EMERGENCY DEPARTMENT Provider Note   CSN: [id number] Arrival date & time: 01/27/20  1406     History Chief Complaint  Patient presents with  . Abdominal Pain    Tara Houston is a 49 y.o. female.  49 yo F with a cc of epigastric abdominal burning.  This has been off and on for the past 3 days.  The patient states that it happens when she has something to eat or drink.  Is been seen at [facility] as well as been to an urgent care center twice.  She was started on famotidine but had worsening of her symptoms and if she took it so was only taken a couple doses.  She has scheduled a colonoscopy in a couple weeks time and is try to reach out to the GI doctors but is not yet seen them in the office.  Has had trouble eating or drinking anything for the past day or so.  Denies fevers denies vomiting.  Pain is substernal.  Not exertional.  No shortness of breath.  Started after she had a couple bites of room temperature [place] food at [employer].  The history is provided by the patient.  Abdominal Pain Pain location:  Epigastric Pain quality: burning   Pain radiates to:  Does not radiate Pain severity:  Moderate Onset quality:  Gradual Duration:  3 days Timing:  Constant Progression:  Worsening Chronicity:  New Relieved by:  Nothing Worsened by:  Eating Ineffective treatments:  None tried Associated symptoms: nausea   Associated symptoms: no chest pain, no chills, no diarrhea, no dysuria, no fever, no shortness of breath and no vomiting        Past Medical History:  Diagnosis Date  . Asthma    exercise induced, as a young adult.  . Bronchitis   . Low iron   . PONV (postoperative nausea and vomiting)     There are no problems to display for this patient.   Past Surgical History:  Procedure Laterality Date  . CYST EXCISION     removed from leg  . DILITATION & CURRETTAGE/HYSTROSCOPY WITH HYDROTHERMAL ABLATION N/A 11/21/2019     Procedure: DILATATION & CURETTAGE/HYSTEROSCOPY WITH HYDROTHERMAL ABLATION;  Surgeon: Janyth Pupa, DO;  Location: [facility];  Service: Gynecology;  Laterality: N/A;  . TUBAL LIGATION    . VARICOSE VEIN SURGERY       OB History   No obstetric history on file.     Family History  Problem Relation Age of Onset  . Breast cancer Mother 71    Social History   Tobacco Use  . Smoking status: Never Smoker  . Smokeless tobacco: Never Used  Vaping Use  . Vaping Use: Never used  Substance Use Topics  . Alcohol use: Yes    Comment: occ  . Drug use: Never    Home Medications Prior to Admission medications   Medication Sig Start Date End Date Taking? Authorizing Provider  Acetaminophen (TYLENOL PO) Take by mouth.    [provider]  budesonide-formoterol (SYMBICORT) 160-4.5 MCG/ACT inhaler Inhale 2 puffs into the lungs 2 (two) times daily. 01/14/20   Mannam, Hart Robinsons, MD  dicyclomine (BENTYL) 20 MG tablet Take 1 tablet (20 mg total) by mouth 4 (four) times daily -  before meals and at bedtime. Take as needed for pain and spasm 01/23/20   Blanchie Dessert, MD  ferrous sulfate 324 MG TBEC Take 324 mg by mouth.  [provider]  Multiple Vitamin (MULTIVITAMIN WITH MINERALS) TABS tablet Take 1 tablet by mouth daily.    [provider]    Allergies    Pollen extract  Review of Systems   Review of Systems  Constitutional: Negative for chills and fever.  HENT: Negative for congestion and rhinorrhea.   Eyes: Negative for redness and visual disturbance.  Respiratory: Negative for shortness of breath and wheezing.   Cardiovascular: Negative for chest pain and palpitations.  Gastrointestinal: Positive for abdominal pain and nausea. Negative for diarrhea and vomiting.  Genitourinary: Negative for dysuria and urgency.  Musculoskeletal: Negative for arthralgias and myalgias.  Skin: Negative for pallor and wound.  Neurological: Negative for  dizziness and headaches.    Physical Exam Updated Vital Signs BP 117/75 (BP Location: Right Arm)   Pulse 98   Temp 98.6 F (37 C) (Oral)   Resp 17   Ht 5\' 8"  (1.727 m)   Wt 109.8 kg   SpO2 100%   BMI 36.80 kg/m   Physical Exam  ED Results / Procedures / Treatments   Labs (all labs ordered are listed, but only abnormal results are displayed) Labs Reviewed  COMPREHENSIVE METABOLIC PANEL - Abnormal; Notable for the following components:      Result Value   Potassium 3.4 (*)    Glucose, Bld 106 (*)    BUN 5 (*)    All other components within normal limits  CBC - Abnormal; Notable for the following components:   WBC 12.1 (*)    Hemoglobin 10.4 (*)    HCT 35.1 (*)    MCH 24.4 (*)    MCHC 29.6 (*)    RDW 20.4 (*)    Platelets 524 (*)    All other components within normal limits  LIPASE, BLOOD  URINALYSIS, ROUTINE W REFLEX MICROSCOPIC  I-STAT BETA HCG BLOOD, ED ([facility], [facility], [facility] ONLY)    EKG EKG Interpretation  Date/Time:  Sunday January 27 2020 14:25:44 EDT Ventricular Rate:  93 PR Interval:  130 QRS Duration: 70 QT Interval:  340 QTC Calculation: 422 R Axis:   10 Text Interpretation: Normal sinus rhythm Possible Inferior infarct , age undetermined Abnormal ECG No significant change since last tracing Confirmed by Tunya Held ([id number]) on 01/27/2020 3:50:50 PM   Radiology No results found.  Procedures Procedures (including critical care time)  Medications Ordered in ED Medications  alum & mag hydroxide-simeth (MAALOX/MYLANTA) 200-200-20 MG/5ML suspension 30 mL (30 mLs Oral Given 01/27/20 1633)    And  lidocaine (XYLOCAINE) 2 % viscous mouth solution 15 mL (15 mLs Oral Given 01/27/20 1634)  famotidine (PEPCID) tablet 40 mg (40 mg Oral Given 01/27/20 1633)    ED Course  I have reviewed the triage vital signs and the nursing notes.  Pertinent labs & imaging results that were available during my care of the patient were reviewed by me and considered in my medical  decision making (see chart for details).    MDM Rules/Calculators/A&P                          49  yo F with a chief complaints of epigastric abdominal burning.  No significant pain on exam LFTs and lipase are unremarkable.  This is the patient's fourth visit, and to even though she is not had any pain to the right upper quadrant fevers or vomiting I did offer her ultrasound which she is declining.  We will have her try to  take the pepcid or Tagamet and more regularly.  We will have her follow-up with her GI doctor.  4:43 PM:  I have discussed the diagnosis/risks/treatment options with the patient and believe the pt to be eligible for discharge home to follow-up with PCP. We also discussed returning to the ED immediately if new or worsening sx occur. We discussed the sx which are most concerning (e.g., sudden worsening pain, fever, inability to tolerate by mouth) that necessitate immediate return. Medications administered to the patient during their visit and any new prescriptions provided to the patient are listed below.  Medications given during this visit Medications  alum & mag hydroxide-simeth (MAALOX/MYLANTA) 200-200-20 MG/5ML suspension 30 mL (30 mLs Oral Given 01/27/20 1633)    And  lidocaine (XYLOCAINE) 2 % viscous mouth solution 15 mL (15 mLs Oral Given 01/27/20 1634)  famotidine (PEPCID) tablet 40 mg (40 mg Oral Given 01/27/20 1633)     The patient appears reasonably screen and/or stabilized for discharge and I doubt any other medical condition or other [facility] requiring further screening, evaluation, or treatment in the ED at this time prior to discharge.     Final Clinical Impression(s) / ED Diagnoses Final diagnoses:  Epigastric abdominal pain    Rx / DC Orders ED Discharge Orders    None       Deno Etienne, DO 01/27/20 1643

## 2020-01-27 NOTE — Discharge Instructions (Addendum)
Try pepcid or tagamet up to twice a day.  Try to avoid things that may make this worse, most commonly these are spicy foods tomato based products fatty foods chocolate and peppermint.  Alcohol and tobacco can also make this worse.  Return to the emergency department for sudden worsening pain fever or inability to eat or drink.  Call your GI doctor and see if they can see you in the office.  They may want to add on an endoscopy to your planned procedure.

## 2020-01-27 NOTE — ED Triage Notes (Signed)
Pt reports upper abd pain/ burning x 1 week with nausea.  Seen at [facility] for same.  Denies vomiting and diarrhea.

## 2020-03-17 ENCOUNTER — Ambulatory Visit: Payer: 59 | Admitting: Pulmonary Disease

## 2020-04-20 ENCOUNTER — Other Ambulatory Visit: Payer: Self-pay | Admitting: Hematology and Oncology

## 2020-04-20 DIAGNOSIS — D500 Iron deficiency anemia secondary to blood loss (chronic): Secondary | ICD-10-CM

## 2020-04-20 NOTE — Progress Notes (Signed)
No show

## 2020-04-21 ENCOUNTER — Inpatient Hospital Stay: Payer: 59

## 2020-04-21 ENCOUNTER — Inpatient Hospital Stay: Payer: 59 | Attending: Hematology and Oncology | Admitting: Hematology and Oncology

## 2020-04-21 DIAGNOSIS — D473 Essential (hemorrhagic) thrombocythemia: Secondary | ICD-10-CM

## 2020-04-21 DIAGNOSIS — D500 Iron deficiency anemia secondary to blood loss (chronic): Secondary | ICD-10-CM

## 2020-10-09 ENCOUNTER — Other Ambulatory Visit: Payer: Self-pay | Admitting: Obstetrics and Gynecology

## 2020-10-09 DIAGNOSIS — Z1231 Encounter for screening mammogram for malignant neoplasm of breast: Secondary | ICD-10-CM

## 2020-12-02 ENCOUNTER — Ambulatory Visit
Admission: RE | Admit: 2020-12-02 | Discharge: 2020-12-02 | Disposition: A | Discharge status: Home / Self Care | Payer: 59 | Source: Ambulatory Visit | Attending: Obstetrics and Gynecology | Admitting: Obstetrics and Gynecology

## 2020-12-02 ENCOUNTER — Other Ambulatory Visit: Payer: Self-pay

## 2020-12-02 DIAGNOSIS — Z1231 Encounter for screening mammogram for malignant neoplasm of breast: Secondary | ICD-10-CM

## 2021-01-02 ENCOUNTER — Encounter: Payer: Self-pay | Admitting: *Deleted

## 2021-02-09 ENCOUNTER — Telehealth: Payer: Self-pay | Admitting: Hematology and Oncology

## 2021-02-09 NOTE — Telephone Encounter (Signed)
Pt had called in to r/s her missed appt from January with Dr. Lorenso Courier. She told me her PCP had recommended she f/u. I r/s her appts. She is aware of appts date and time.

## 2021-02-20 ENCOUNTER — Inpatient Hospital Stay: Payer: 59 | Attending: Hematology and Oncology

## 2021-02-20 ENCOUNTER — Inpatient Hospital Stay (HOSPITAL_BASED_OUTPATIENT_CLINIC_OR_DEPARTMENT_OTHER): Payer: 59 | Admitting: Hematology and Oncology

## 2021-02-20 ENCOUNTER — Other Ambulatory Visit: Payer: Self-pay

## 2021-02-20 DIAGNOSIS — K5900 Constipation, unspecified: Secondary | ICD-10-CM | POA: Insufficient documentation

## 2021-02-20 DIAGNOSIS — Z79899 Other long term (current) drug therapy: Secondary | ICD-10-CM | POA: Insufficient documentation

## 2021-02-20 DIAGNOSIS — D500 Iron deficiency anemia secondary to blood loss (chronic): Secondary | ICD-10-CM | POA: Insufficient documentation

## 2021-02-20 DIAGNOSIS — Z7901 Long term (current) use of anticoagulants: Secondary | ICD-10-CM | POA: Diagnosis not present

## 2021-02-20 DIAGNOSIS — Z803 Family history of malignant neoplasm of breast: Secondary | ICD-10-CM | POA: Insufficient documentation

## 2021-02-20 DIAGNOSIS — R0602 Shortness of breath: Secondary | ICD-10-CM | POA: Diagnosis not present

## 2021-02-20 DIAGNOSIS — Z7951 Long term (current) use of inhaled steroids: Secondary | ICD-10-CM | POA: Insufficient documentation

## 2021-02-20 DIAGNOSIS — R58 Hemorrhage, not elsewhere classified: Secondary | ICD-10-CM | POA: Diagnosis not present

## 2021-02-20 DIAGNOSIS — R5383 Other fatigue: Secondary | ICD-10-CM | POA: Insufficient documentation

## 2021-02-20 DIAGNOSIS — R519 Headache, unspecified: Secondary | ICD-10-CM | POA: Insufficient documentation

## 2021-02-20 LAB — CMP (CANCER CENTER ONLY)
ALT: 16 U/L (ref 0–44)
AST: 13 U/L — ABNORMAL LOW (ref 15–41)
Albumin: 3.5 g/dL (ref 3.5–5.0)
Alkaline Phosphatase: 61 U/L (ref 38–126)
Anion gap: 7 (ref 5–15)
BUN: 9 mg/dL (ref 6–20)
CO2: 24 mmol/L (ref 22–32)
Calcium: 8.8 mg/dL — ABNORMAL LOW (ref 8.9–10.3)
Chloride: 109 mmol/L (ref 98–111)
Creatinine: 0.7 mg/dL (ref 0.44–1.00)
GFR, Estimated: >60 mL/min (ref >60)
Glucose, Bld: 89 mg/dL (ref 70–99)
Potassium: 3.6 mmol/L (ref 3.5–5.1)
Sodium: 140 mmol/L (ref 135–145)
Total Bilirubin: 0.7 mg/dL (ref 0.3–1.2)
Total Protein: 6.4 g/dL — ABNORMAL LOW (ref 6.5–8.1)

## 2021-02-20 LAB — IRON AND TIBC
Iron: 39 ug/dL — ABNORMAL LOW (ref 41–142)
Saturation Ratios: 10 % — ABNORMAL LOW (ref 21–57)
TIBC: 409 ug/dL (ref 236–444)
UIBC: 370 ug/dL (ref 120–384)

## 2021-02-20 LAB — CBC WITH DIFFERENTIAL (CANCER CENTER ONLY)
Abs Immature Granulocytes: 0.01 10*3/uL (ref 0.00–0.07)
Basophils Absolute: 0.1 10*3/uL (ref 0.0–0.1)
Basophils Relative: 1 %
Eosinophils Absolute: 0.2 10*3/uL (ref 0.0–0.5)
Eosinophils Relative: 3 %
HCT: 31.8 % — ABNORMAL LOW (ref 36.0–46.0)
Hemoglobin: 10.5 g/dL — ABNORMAL LOW (ref 12.0–15.0)
Immature Granulocytes: 0 %
Lymphocytes Relative: 32 %
Lymphs Abs: 1.9 10*3/uL (ref 0.7–4.0)
MCH: 27.5 pg (ref 26.0–34.0)
MCHC: 33 g/dL (ref 30.0–36.0)
MCV: 83.2 fL (ref 80.0–100.0)
Monocytes Absolute: 0.6 10*3/uL (ref 0.1–1.0)
Monocytes Relative: 9 %
Neutro Abs: 3.4 10*3/uL (ref 1.7–7.7)
Neutrophils Relative %: 55 %
Platelet Count: 358 10*3/uL (ref 150–400)
RBC: 3.82 MIL/uL — ABNORMAL LOW (ref 3.87–5.11)
RDW: 14.6 % (ref 11.5–15.5)
WBC Count: 6.1 10*3/uL (ref 4.0–10.5)
nRBC: 0 % (ref 0.0–0.2)

## 2021-02-20 LAB — RETIC PANEL
Immature Retic Fract: 14.4 % (ref 2.3–15.9)
RBC.: 3.84 MIL/uL — ABNORMAL LOW (ref 3.87–5.11)
Retic Count, Absolute: 74.1 10*3/uL (ref 19.0–186.0)
Retic Ct Pct: 1.9 % (ref 0.4–3.1)
Reticulocyte Hemoglobin: 29.9 pg (ref >27.9)

## 2021-02-20 LAB — FERRITIN: Ferritin: <4 ng/mL — ABNORMAL LOW (ref 11–307)

## 2021-02-20 NOTE — Progress Notes (Signed)
[place] [phone]   [phone]) [phone]  PROGRESS NOTE  Patient Care Team: Pcp, No as PCP - General  Hematological/Oncological History # Iron Deficiency Anemia 2/2 GYN Bleeding 1) 11/20/2019: WBC 9.9, Hgb 8.0, MCV 78.6, Plt 535 2) 12/18/2019: WBC 5.2, Hgb 8.4, MCV 71, Plt 572. Iron 17, Iron Sat 4% 3) 01/14/2020: WBC 6.6, Hgb 9.7, MCV 78, Plt 590 4) 01/18/2020: establish care with Dr. Lorenso Courier. Recommend proceeding with PO iron therapy.  5) 01/18/2020-02/20/2021: lost to follow up 6) 02/20/2021: reconnected with Dr. Lorenso Courier. Hgb 10.5, MCV 83.2, Plt 358.    Interval History:  Tara Houston 50 y.o. female with medical history significant for iron deficiency anemia secondary to GYN bleeding who presents for a follow up visit. The patient's last visit was on 01/18/2020. In the interim since the last visit the patient stopped taking her iron pills due to constipation and was lost to follow-up.  Tara Houston reports that she has been well overall in the interim since her last visit.  She reports that she attempted to take the iron pills but began developing very bad stomach ache and constipation.  She notes that she underwent an EGD colonoscopy shortly thereafter and was found to have stomach ulcers.  She also reports that she is lost to follow-up due to the death of her mother from pancreatic cancer.  She underwent ablation prior to our last visit and reports that she did begin developing some breakthrough bleeding but was subsequently started on birth control pills.  She notes that she has been having about 1 to 2 months of spotting since then.  Unfortunately treating started on birth control pills.  She was found to have "blood clots in her legs".  And is being followed by [facility] for this.  She is also been started on Eliquis therapy reports that she will soon be rechecked with ultrasound and neck steps will be determined.  She does endorse having issues  with fatigue, shortness of breath, and headache.  She otherwise denies any fevers, chills, sweats, nausea, vomiting or diarrhea.  Full 10 point ROS is listed below.  MEDICAL HISTORY:  Past Medical History:  Diagnosis Date   Asthma    exercise induced, as a young adult.   Bronchitis    Low iron    PONV (postoperative nausea and vomiting)     SURGICAL HISTORY: Past Surgical History:  Procedure Laterality Date   CYST EXCISION     removed from leg   DILITATION & CURRETTAGE/HYSTROSCOPY WITH HYDROTHERMAL ABLATION N/A 11/21/2019   Procedure: DILATATION & CURETTAGE/HYSTEROSCOPY WITH HYDROTHERMAL ABLATION;  Surgeon: Janyth Pupa, DO;  Location: [place];  Service: Gynecology;  Laterality: N/A;   TUBAL LIGATION     VARICOSE VEIN SURGERY      SOCIAL HISTORY: Social History   Socioeconomic History   Marital status: Married    Spouse name: Pieter Partridge   Number of children: Not on file   Years of education: Not on file   Highest education level: Not on file  Occupational History   Not on file  Tobacco Use   Smoking status: Never   Smokeless tobacco: Never  Vaping Use   Vaping Use: Never used  Substance and Sexual Activity   Alcohol use: Yes    Comment: occ   Drug use: Never   Sexual activity: Not on file  Other Topics Concern   Not on file  Social History Narrative   Not on file  Social Determinants of Health   Financial Resource Strain: Not on file  Food Insecurity: Not on file  Transportation Needs: Not on file  Physical Activity: Not on file  Stress: Not on file  Social Connections: Not on file  Intimate Partner Violence: Not on file    FAMILY HISTORY: Family History  Problem Relation Age of Onset   Breast cancer Mother 59    ALLERGIES:  is allergic to pollen extract.  MEDICATIONS:  Current Outpatient Medications  Medication Sig Dispense Refill   Acetaminophen (TYLENOL PO) Take by mouth.     budesonide-formoterol (SYMBICORT) 160-4.5 MCG/ACT  inhaler Inhale 2 puffs into the lungs 2 (two) times daily. 1 each 6   dicyclomine (BENTYL) 20 MG tablet Take 1 tablet (20 mg total) by mouth 4 (four) times daily -  before meals and at bedtime. Take as needed for pain and spasm 20 tablet 0   ferrous sulfate 324 MG TBEC Take 324 mg by mouth.     Multiple Vitamin (MULTIVITAMIN WITH MINERALS) TABS tablet Take 1 tablet by mouth daily.     No current facility-administered medications for this visit.    REVIEW OF SYSTEMS:   Constitutional: ( - ) fevers, ( - )  chills , ( - ) night sweats Eyes: ( - ) blurriness of vision, ( - ) double vision, ( - ) watery eyes Ears, nose, mouth, throat, and face: ( - ) mucositis, ( - ) sore throat Respiratory: ( - ) cough, ( - ) dyspnea, ( - ) wheezes Cardiovascular: ( - ) palpitation, ( - ) chest discomfort, ( - ) lower extremity swelling Gastrointestinal:  ( - ) nausea, ( - ) heartburn, ( - ) change in bowel habits Skin: ( - ) abnormal skin rashes Lymphatics: ( - ) new lymphadenopathy, ( - ) easy bruising Neurological: ( - ) numbness, ( - ) tingling, ( - ) new weaknesses Behavioral/Psych: ( - ) mood change, ( - ) new changes  All other systems were reviewed with the patient and are negative.  PHYSICAL EXAMINATION: ECOG PERFORMANCE STATUS: 0 - Asymptomatic  Vitals:   02/20/21 1207  BP: (!) 148/79  Pulse: 71  Resp: 17  Temp: (!) 97.4 F (36.3 C)  SpO2: 97%   Filed Weights   02/20/21 1207  Weight: 245 lb 3.2 oz (111.2 kg)    GENERAL: Well-appearing middle-age Caucasian female, alert, no distress and comfortable SKIN: skin color, texture, turgor are normal, no rashes or significant lesions EYES: conjunctiva are pink and non-injected, sclera clear LUNGS: clear to auscultation and percussion with normal breathing effort HEART: regular rate & rhythm and no murmurs and no lower extremity edema Musculoskeletal: no cyanosis of digits and no clubbing  PSYCH: alert & oriented x 3, fluent speech NEURO: no  focal motor/sensory deficits  LABORATORY DATA:  I have reviewed the data as listed CBC Latest Ref Rng & Units 02/20/2021 01/27/2020 01/23/2020  WBC 4.0 - 10.5 K/uL 6.1 12.1(H) 11.7(H)  Hemoglobin 12.0 - 15.0 g/dL 10.5(L) 10.4(L) 10.6(L)  Hematocrit 36.0 - 46.0 % 31.8(L) 35.1(L) 35.0(L)  Platelets 150 - 400 K/uL 358 524(H) 463(H)    CMP Latest Ref Rng & Units 02/20/2021 01/27/2020 01/23/2020  Glucose 70 - 99 mg/dL 89 106(H) 118(H)  BUN 6 - 20 mg/dL 9 5(L) 6  Creatinine 0.44 - 1.00 mg/dL 0.70 0.71 0.68  Sodium 135 - 145 mmol/L 140 135 136  Potassium 3.5 - 5.1 mmol/L 3.6 3.4(L) 3.8  Chloride 98 - 111  mmol/L 109 99 102  CO2 22 - 32 mmol/L 24 25 27   Calcium 8.9 - 10.3 mg/dL 8.8(L) 9.2 8.9  Total Protein 6.5 - 8.1 g/dL 6.4(L) 6.9 7.1  Total Bilirubin 0.3 - 1.2 mg/dL 0.7 1.0 0.8  Alkaline Phos 38 - 126 U/L 61 66 59  AST 15 - 41 U/L 13(L) 16 18  ALT 0 - 44 U/L 16 22 32   RADIOGRAPHIC STUDIES: No results found.  ASSESSMENT & PLAN Raelea Cesiah Westley 50 y.o. female with medical history significant for iron deficiency anemia secondary to GYN bleeding who presents for a follow up visit.   # Iron Deficiency Anemia 2/2 GYN Bleeding -- Patient unable to tolerate p.o. iron therapy due to constipation and stomach upset --Patient remains iron deficient with hemoglobin stable at 10.5 -- Plan to proceed with IV iron sucrose 200 mg q. 7 days x 5 doses --We will plan to have patient return to clinic in 4 to 6 weeks after last dose of IV iron to reassess --Defer management menstrual cycles to OB/GYN  # Lower Extremity Blood Clots --currently on eliquis therapy, managed by [facility] -- Likely secondary to estrogen-based birth control, would consider this a provoked episode.  Images not available in our system will defer to these specialists for management moving forward --continue to monitor   No orders of the defined types were placed in this encounter.   All questions were  answered. The patient knows to call the clinic with any problems, questions or concerns.  A total of more than 30 minutes were spent on this encounter with face-to-face time and non-face-to-face time, including preparing to see the patient, ordering tests and/or medications, counseling the patient and coordination of care as outlined above.   Ledell Peoples, MD Department of Hematology/Oncology [place] [facility] Phone: [phone] Pager: [phone] Email: [email or web address]  02/20/2021 4:01 PM

## 2021-02-23 ENCOUNTER — Other Ambulatory Visit: Payer: Self-pay | Admitting: Pharmacy Technician

## 2021-02-27 ENCOUNTER — Ambulatory Visit (INDEPENDENT_AMBULATORY_CARE_PROVIDER_SITE_OTHER): Payer: 59

## 2021-02-27 ENCOUNTER — Other Ambulatory Visit: Payer: Self-pay

## 2021-02-27 VITALS — BP 145/84 | HR 63 | Temp 98.4°F | Resp 16 | Ht 68.0 in | Wt 245.0 lb

## 2021-02-27 DIAGNOSIS — D500 Iron deficiency anemia secondary to blood loss (chronic): Secondary | ICD-10-CM | POA: Diagnosis not present

## 2021-02-27 MED ORDER — METHYLPREDNISOLONE SODIUM SUCC 125 MG IJ SOLR: 125.0000 mg | Freq: Once | INTRAMUSCULAR | Status: DC | PRN

## 2021-02-27 MED ORDER — SODIUM CHLORIDE 0.9 % IV SOLN: Freq: Once | INTRAVENOUS | Status: DC | PRN

## 2021-02-27 MED ORDER — FAMOTIDINE IN NACL 20-0.9 MG/50ML-% IV SOLN: 20.0000 mg | Freq: Once | INTRAVENOUS | Status: DC | PRN

## 2021-02-27 MED ORDER — DIPHENHYDRAMINE HCL 50 MG/ML IJ SOLN: 50.0000 mg | Freq: Once | INTRAMUSCULAR | Status: DC | PRN

## 2021-02-27 MED ORDER — DIPHENHYDRAMINE HCL 25 MG PO CAPS
50.0000 mg | ORAL_CAPSULE | Freq: Once | ORAL | Status: AC
  Administered 2021-02-27: 50 mg via ORAL
  Filled 2021-02-27: qty 2

## 2021-02-27 MED ORDER — EPINEPHRINE 0.3 MG/0.3ML IJ SOAJ: 0.3000 mg | Freq: Once | INTRAMUSCULAR | Status: DC | PRN

## 2021-02-27 MED ORDER — ALBUTEROL SULFATE HFA 108 (90 BASE) MCG/ACT IN AERS: 2.0000 | INHALATION_SPRAY | Freq: Once | RESPIRATORY_TRACT | Status: DC | PRN

## 2021-02-27 MED ORDER — ACETAMINOPHEN 325 MG PO TABS
650.0000 mg | ORAL_TABLET | Freq: Once | ORAL | Status: AC
  Administered 2021-02-27: 650 mg via ORAL
  Filled 2021-02-27: qty 2

## 2021-02-27 MED ORDER — SODIUM CHLORIDE 0.9 % IV SOLN
200.0000 mg | Freq: Once | INTRAVENOUS | Status: AC
  Administered 2021-02-27: 200 mg via INTRAVENOUS
  Filled 2021-02-27: qty 10

## 2021-02-27 NOTE — Progress Notes (Signed)
Diagnosis: Iron Deficiency Anemia  Provider:  Marshell Garfinkel, MD  Procedure: Infusion  IV Type: Peripheral, IV Location: L Antecubital  Venofer (Iron Sucrose), Dose: 200 mg  Infusion Start Time: 7741  Infusion Stop Time: 1216  Post Infusion IV Care: Observation period completed and Peripheral IV Discontinued  Discharge: Condition: Good, Destination: Home . AVS provided to patient.   Performed by:  Charlie Pitter, RN

## 2021-03-04 ENCOUNTER — Other Ambulatory Visit: Payer: Self-pay

## 2021-03-04 ENCOUNTER — Ambulatory Visit (INDEPENDENT_AMBULATORY_CARE_PROVIDER_SITE_OTHER): Payer: 59

## 2021-03-04 VITALS — BP 143/87 | HR 78 | Temp 98.3°F | Resp 20 | Ht 68.0 in | Wt 245.6 lb

## 2021-03-04 DIAGNOSIS — D500 Iron deficiency anemia secondary to blood loss (chronic): Secondary | ICD-10-CM | POA: Diagnosis not present

## 2021-03-04 MED ORDER — SODIUM CHLORIDE 0.9 % IV SOLN
200.0000 mg | Freq: Once | INTRAVENOUS | Status: AC
  Administered 2021-03-04: 200 mg via INTRAVENOUS
  Filled 2021-03-04: qty 10

## 2021-03-04 MED ORDER — ACETAMINOPHEN 325 MG PO TABS: 650.0000 mg | ORAL_TABLET | Freq: Once | ORAL | Status: DC

## 2021-03-04 MED ORDER — DIPHENHYDRAMINE HCL 25 MG PO CAPS: 50.0000 mg | ORAL_CAPSULE | Freq: Once | ORAL | Status: DC

## 2021-03-04 NOTE — Progress Notes (Signed)
Diagnosis: Iron Deficiency Anemia  Provider:  Marshell Garfinkel, MD  Procedure: Infusion  IV Type: Peripheral, IV Location: R Antecubital  Venofer (Iron Sucrose), Dose: 200 mg  Infusion Start Time: 1106am  Infusion Stop Time: 1130am  Post Infusion IV Care: Peripheral IV Discontinued  Discharge: Condition: Good, Destination: Home . AVS provided to patient.   Performed by:  Koren Shiver, RN

## 2021-03-13 ENCOUNTER — Other Ambulatory Visit: Payer: Self-pay

## 2021-03-13 ENCOUNTER — Ambulatory Visit (INDEPENDENT_AMBULATORY_CARE_PROVIDER_SITE_OTHER): Payer: 59

## 2021-03-13 VITALS — BP 143/83 | HR 64 | Temp 97.9°F | Resp 16 | Ht 68.0 in | Wt 245.8 lb

## 2021-03-13 DIAGNOSIS — D500 Iron deficiency anemia secondary to blood loss (chronic): Secondary | ICD-10-CM | POA: Diagnosis not present

## 2021-03-13 MED ORDER — ACETAMINOPHEN 325 MG PO TABS: 650.0000 mg | ORAL_TABLET | Freq: Once | ORAL | Status: DC

## 2021-03-13 MED ORDER — SODIUM CHLORIDE 0.9 % IV SOLN
200.0000 mg | Freq: Once | INTRAVENOUS | Status: AC
  Administered 2021-03-13: 200 mg via INTRAVENOUS
  Filled 2021-03-13: qty 10

## 2021-03-13 MED ORDER — DIPHENHYDRAMINE HCL 25 MG PO CAPS: 50.0000 mg | ORAL_CAPSULE | Freq: Once | ORAL | Status: DC

## 2021-03-13 MED ORDER — DIPHENHYDRAMINE HCL 50 MG/ML IJ SOLN: 50.0000 mg | Freq: Once | INTRAMUSCULAR | Status: DC | PRN

## 2021-03-13 MED ORDER — ALBUTEROL SULFATE HFA 108 (90 BASE) MCG/ACT IN AERS: 2.0000 | INHALATION_SPRAY | Freq: Once | RESPIRATORY_TRACT | Status: DC | PRN

## 2021-03-13 MED ORDER — EPINEPHRINE 0.3 MG/0.3ML IJ SOAJ: 0.3000 mg | Freq: Once | INTRAMUSCULAR | Status: DC | PRN

## 2021-03-13 MED ORDER — SODIUM CHLORIDE 0.9 % IV SOLN: Freq: Once | INTRAVENOUS | Status: DC | PRN

## 2021-03-13 MED ORDER — METHYLPREDNISOLONE SODIUM SUCC 125 MG IJ SOLR: 125.0000 mg | Freq: Once | INTRAMUSCULAR | Status: DC | PRN

## 2021-03-13 MED ORDER — FAMOTIDINE IN NACL 20-0.9 MG/50ML-% IV SOLN: 20.0000 mg | Freq: Once | INTRAVENOUS | Status: DC | PRN

## 2021-03-13 NOTE — Progress Notes (Signed)
Diagnosis: Iron Deficiency Anemia  Provider:  Marshell Garfinkel, MD  Procedure: Infusion  IV Type: Peripheral, IV Location: L Antecubital  Venofer (Iron Sucrose), Dose: 200 mg  Infusion Start Time: 1110  Infusion Stop Time: 1150  Post Infusion IV Care: Peripheral IV Discontinued  Discharge: Condition: Good, Destination: Home . AVS provided to patient.   Performed by:  Khushi Zupko, Sherlon Handing, LPN

## 2021-03-20 ENCOUNTER — Ambulatory Visit (INDEPENDENT_AMBULATORY_CARE_PROVIDER_SITE_OTHER): Payer: 59

## 2021-03-20 ENCOUNTER — Other Ambulatory Visit: Payer: Self-pay

## 2021-03-20 VITALS — BP 131/84 | HR 65 | Temp 97.8°F | Resp 18 | Ht 68.0 in | Wt 243.0 lb

## 2021-03-20 DIAGNOSIS — N920 Excessive and frequent menstruation with regular cycle: Secondary | ICD-10-CM

## 2021-03-20 DIAGNOSIS — D500 Iron deficiency anemia secondary to blood loss (chronic): Secondary | ICD-10-CM

## 2021-03-20 MED ORDER — SODIUM CHLORIDE 0.9 % IV SOLN
200.0000 mg | Freq: Once | INTRAVENOUS | Status: AC
  Administered 2021-03-20: 200 mg via INTRAVENOUS
  Filled 2021-03-20: qty 10

## 2021-03-20 MED ORDER — SODIUM CHLORIDE 0.9 % IV SOLN: Freq: Once | INTRAVENOUS | Status: DC | PRN

## 2021-03-20 MED ORDER — METHYLPREDNISOLONE SODIUM SUCC 125 MG IJ SOLR: 125.0000 mg | Freq: Once | INTRAMUSCULAR | Status: DC | PRN

## 2021-03-20 MED ORDER — ACETAMINOPHEN 325 MG PO TABS: 650.0000 mg | ORAL_TABLET | Freq: Once | ORAL | Status: DC

## 2021-03-20 MED ORDER — ALBUTEROL SULFATE HFA 108 (90 BASE) MCG/ACT IN AERS: 2.0000 | INHALATION_SPRAY | Freq: Once | RESPIRATORY_TRACT | Status: DC | PRN

## 2021-03-20 MED ORDER — FAMOTIDINE IN NACL 20-0.9 MG/50ML-% IV SOLN: 20.0000 mg | Freq: Once | INTRAVENOUS | Status: DC | PRN

## 2021-03-20 MED ORDER — EPINEPHRINE 0.3 MG/0.3ML IJ SOAJ: 0.3000 mg | Freq: Once | INTRAMUSCULAR | Status: DC | PRN

## 2021-03-20 MED ORDER — DIPHENHYDRAMINE HCL 25 MG PO CAPS: 50.0000 mg | ORAL_CAPSULE | Freq: Once | ORAL | Status: DC

## 2021-03-20 MED ORDER — DIPHENHYDRAMINE HCL 50 MG/ML IJ SOLN: 50.0000 mg | Freq: Once | INTRAMUSCULAR | Status: DC | PRN

## 2021-03-20 NOTE — Progress Notes (Signed)
Diagnosis: Iron Deficiency Anemia  Provider:  Marshell Garfinkel, MD  Procedure: Infusion  IV Type: Peripheral, IV Location: L Antecubital  Venofer (Iron Sucrose), Dose: 200 mg  Infusion Start Time: 1024  Infusion Stop Time: 3149  Post Infusion IV Care: Peripheral IV Discontinued  Discharge: Condition: Good, Destination: Home . AVS provided to patient.   Performed by:  Jannah Guardiola, Sherlon Handing, LPN

## 2021-03-27 ENCOUNTER — Other Ambulatory Visit: Payer: Self-pay

## 2021-03-27 ENCOUNTER — Ambulatory Visit (INDEPENDENT_AMBULATORY_CARE_PROVIDER_SITE_OTHER): Payer: 59 | Admitting: *Deleted

## 2021-03-27 VITALS — BP 113/81 | HR 79 | Temp 98.3°F | Resp 16 | Ht 68.0 in | Wt 245.2 lb

## 2021-03-27 DIAGNOSIS — N920 Excessive and frequent menstruation with regular cycle: Secondary | ICD-10-CM | POA: Diagnosis not present

## 2021-03-27 DIAGNOSIS — D500 Iron deficiency anemia secondary to blood loss (chronic): Secondary | ICD-10-CM

## 2021-03-27 MED ORDER — ALBUTEROL SULFATE HFA 108 (90 BASE) MCG/ACT IN AERS: 2.0000 | INHALATION_SPRAY | Freq: Once | RESPIRATORY_TRACT | Status: DC | PRN

## 2021-03-27 MED ORDER — EPINEPHRINE 0.3 MG/0.3ML IJ SOAJ: 0.3000 mg | Freq: Once | INTRAMUSCULAR | Status: DC | PRN

## 2021-03-27 MED ORDER — DIPHENHYDRAMINE HCL 25 MG PO CAPS: 50.0000 mg | ORAL_CAPSULE | Freq: Once | ORAL | Status: DC

## 2021-03-27 MED ORDER — DIPHENHYDRAMINE HCL 50 MG/ML IJ SOLN: 50.0000 mg | Freq: Once | INTRAMUSCULAR | Status: DC | PRN

## 2021-03-27 MED ORDER — ACETAMINOPHEN 325 MG PO TABS: 650.0000 mg | ORAL_TABLET | Freq: Once | ORAL | Status: DC

## 2021-03-27 MED ORDER — FAMOTIDINE IN NACL 20-0.9 MG/50ML-% IV SOLN: 20.0000 mg | Freq: Once | INTRAVENOUS | Status: DC | PRN

## 2021-03-27 MED ORDER — METHYLPREDNISOLONE SODIUM SUCC 125 MG IJ SOLR: 125.0000 mg | Freq: Once | INTRAMUSCULAR | Status: DC | PRN

## 2021-03-27 MED ORDER — SODIUM CHLORIDE 0.9 % IV SOLN: Freq: Once | INTRAVENOUS | Status: DC | PRN

## 2021-03-27 MED ORDER — SODIUM CHLORIDE 0.9 % IV SOLN
200.0000 mg | Freq: Once | INTRAVENOUS | Status: AC
  Administered 2021-03-27: 200 mg via INTRAVENOUS
  Filled 2021-03-27: qty 10

## 2021-03-27 NOTE — Progress Notes (Signed)
Diagnosis: Iron Deficiency Anemia  Provider:  Marshell Garfinkel, MD  Procedure: Infusion  IV Type: Peripheral, IV Location: L Antecubital  Venofer (Iron Sucrose), Dose: 200 mg  Infusion Start Time: 6948 am  Infusion Stop Time: 5462 am  Post Infusion IV Care: Observation period completed and Peripheral IV Discontinued  Discharge: Condition: Good, Destination: Home . AVS provided to patient.   Performed by:  Oren Beckmann, RN

## 2021-05-22 ENCOUNTER — Other Ambulatory Visit: Payer: 59

## 2021-05-22 ENCOUNTER — Other Ambulatory Visit: Payer: Self-pay

## 2021-05-22 ENCOUNTER — Ambulatory Visit: Payer: 59 | Admitting: Hematology

## 2021-05-22 ENCOUNTER — Inpatient Hospital Stay: Payer: 59 | Attending: Hematology and Oncology

## 2021-05-22 ENCOUNTER — Inpatient Hospital Stay (HOSPITAL_BASED_OUTPATIENT_CLINIC_OR_DEPARTMENT_OTHER): Payer: 59 | Admitting: Hematology and Oncology

## 2021-05-22 ENCOUNTER — Other Ambulatory Visit: Payer: Self-pay | Admitting: Hematology and Oncology

## 2021-05-22 VITALS — BP 144/90 | HR 80 | Temp 97.6°F | Resp 17 | Ht 68.0 in | Wt 247.0 lb

## 2021-05-22 DIAGNOSIS — K5900 Constipation, unspecified: Secondary | ICD-10-CM | POA: Insufficient documentation

## 2021-05-22 DIAGNOSIS — D500 Iron deficiency anemia secondary to blood loss (chronic): Secondary | ICD-10-CM

## 2021-05-22 DIAGNOSIS — Z79899 Other long term (current) drug therapy: Secondary | ICD-10-CM | POA: Diagnosis not present

## 2021-05-22 DIAGNOSIS — R58 Hemorrhage, not elsewhere classified: Secondary | ICD-10-CM | POA: Insufficient documentation

## 2021-05-22 DIAGNOSIS — Z7901 Long term (current) use of anticoagulants: Secondary | ICD-10-CM | POA: Insufficient documentation

## 2021-05-22 DIAGNOSIS — Z803 Family history of malignant neoplasm of breast: Secondary | ICD-10-CM | POA: Diagnosis not present

## 2021-05-22 LAB — CBC WITH DIFFERENTIAL (CANCER CENTER ONLY)
Abs Immature Granulocytes: 0.01 10*3/uL (ref 0.00–0.07)
Basophils Absolute: 0 10*3/uL (ref 0.0–0.1)
Basophils Relative: 1 %
Eosinophils Absolute: 0.1 10*3/uL (ref 0.0–0.5)
Eosinophils Relative: 2 %
HCT: 37.1 % (ref 36.0–46.0)
Hemoglobin: 13.1 g/dL (ref 12.0–15.0)
Immature Granulocytes: 0 %
Lymphocytes Relative: 33 %
Lymphs Abs: 2.2 10*3/uL (ref 0.7–4.0)
MCH: 31.8 pg (ref 26.0–34.0)
MCHC: 35.3 g/dL (ref 30.0–36.0)
MCV: 90 fL (ref 80.0–100.0)
Monocytes Absolute: 0.6 10*3/uL (ref 0.1–1.0)
Monocytes Relative: 9 %
Neutro Abs: 3.6 10*3/uL (ref 1.7–7.7)
Neutrophils Relative %: 55 %
Platelet Count: 317 10*3/uL (ref 150–400)
RBC: 4.12 MIL/uL (ref 3.87–5.11)
RDW: 13.6 % (ref 11.5–15.5)
WBC Count: 6.5 10*3/uL (ref 4.0–10.5)
nRBC: 0 % (ref 0.0–0.2)

## 2021-05-22 LAB — CMP (CANCER CENTER ONLY)
ALT: 18 U/L (ref 0–44)
AST: 14 U/L — ABNORMAL LOW (ref 15–41)
Albumin: 4.2 g/dL (ref 3.5–5.0)
Alkaline Phosphatase: 52 U/L (ref 38–126)
Anion gap: 6 (ref 5–15)
BUN: 9 mg/dL (ref 6–20)
CO2: 26 mmol/L (ref 22–32)
Calcium: 9.2 mg/dL (ref 8.9–10.3)
Chloride: 108 mmol/L (ref 98–111)
Creatinine: 0.65 mg/dL (ref 0.44–1.00)
GFR, Estimated: >60 mL/min (ref >60)
Glucose, Bld: 104 mg/dL — ABNORMAL HIGH (ref 70–99)
Potassium: 4 mmol/L (ref 3.5–5.1)
Sodium: 140 mmol/L (ref 135–145)
Total Bilirubin: 1.1 mg/dL (ref 0.3–1.2)
Total Protein: 6.7 g/dL (ref 6.5–8.1)

## 2021-05-22 LAB — RETIC PANEL
Immature Retic Fract: 9.8 % (ref 2.3–15.9)
RBC.: 4.14 MIL/uL (ref 3.87–5.11)
Retic Count, Absolute: 100.6 10*3/uL (ref 19.0–186.0)
Retic Ct Pct: 2.4 % (ref 0.4–3.1)
Reticulocyte Hemoglobin: 35.1 pg (ref >27.9)

## 2021-05-22 LAB — IRON AND IRON BINDING CAPACITY (CC-WL,HP ONLY)
Iron: 118 ug/dL (ref 28–170)
Saturation Ratios: 33 % — ABNORMAL HIGH (ref 10.4–31.8)
TIBC: 361 ug/dL (ref 250–450)
UIBC: 243 ug/dL (ref 148–442)

## 2021-05-22 LAB — FERRITIN: Ferritin: 38 ng/mL (ref 11–307)

## 2021-05-23 NOTE — Progress Notes (Signed)
[place] [phone]   [phone]  PROGRESS NOTE  Patient Care Team: Pcp, No as PCP - General  Hematological/Oncological History # Iron Deficiency Anemia 2/2 GYN Bleeding 1) 11/20/2019: WBC 9.9, Hgb 8.0, MCV 78.6, Plt 535 2) 12/18/2019: WBC 5.2, Hgb 8.4, MCV 71, Plt 572. Iron 17, Iron Sat 4% 3) 01/14/2020: WBC 6.6, Hgb 9.7, MCV 78, Plt 590 4) 01/18/2020: establish care with Dr. Lorenso Courier. Recommend proceeding with PO iron therapy.  5) 01/18/2020-02/20/2021: lost to follow up 6) 02/20/2021: reconnected with Dr. Lorenso Courier. Hgb 10.5, MCV 83.2, Plt 358.   7) 11/18-12/16/2022: Received IV iron sucrose 200 mg x 5 doses  Interval History:  Tara Houston 51 y.o. female with medical history significant for iron deficiency anemia secondary to GYN bleeding who presents for a follow up visit. The patient's last visit was on 01/18/2020. In the interim since the last visit the patient stopped taking her iron pills due to constipation and was lost to follow-up.  Ms. Zilberman reports that she has been well in the interim since her last visit.  She tolerated her iron infusions well and did not have any side effects or reactions due to them.  She notes that she is working with her vein specialist to anticoagulate for blood clots it is developed in her legs.  She notes that she has discontinued birth control and that since that time she has had 2 painful but not particularly heavy menstrual cycles.  She notes her energy levels are good and overall she feels better.  She is not currently taking any iron supplementation.  She notes that her energy is about a 7 or 8 out of 10.  She notes that on Eliquis therapy she is not having any other overt signs of bleeding, bruising, or dark stools.  She otherwise denies any fevers, chills, sweats, nausea, vomiting or diarrhea.  Full 10 point ROS is listed below.  MEDICAL HISTORY:  Past Medical History:  Diagnosis Date   Asthma    exercise  induced, as a young adult.   Bronchitis    Low iron    PONV (postoperative nausea and vomiting)     SURGICAL HISTORY: Past Surgical History:  Procedure Laterality Date   CYST EXCISION     removed from leg   DILITATION & CURRETTAGE/HYSTROSCOPY WITH HYDROTHERMAL ABLATION N/A 11/21/2019   Procedure: DILATATION & CURETTAGE/HYSTEROSCOPY WITH HYDROTHERMAL ABLATION;  Surgeon: Janyth Pupa, DO;  Location: [place];  Service: Gynecology;  Laterality: N/A;   TUBAL LIGATION     VARICOSE VEIN SURGERY      SOCIAL HISTORY: Social History   Socioeconomic History   Marital status: Married    Spouse name: Tara Houston   Number of children: Not on file   Years of education: Not on file   Highest education level: Not on file  Occupational History   Not on file  Tobacco Use   Smoking status: Never   Smokeless tobacco: Never  Vaping Use   Vaping Use: Never used  Substance and Sexual Activity   Alcohol use: Yes    Comment: occ   Drug use: Never   Sexual activity: Not on file  Other Topics Concern   Not on file  Social History Narrative   Not on file   Social Determinants of Health   Financial Resource Strain: Not on file  Food Insecurity: Not on file  Transportation Needs: Not on file  Physical Activity: Not on file  Stress: Not on  file  Social Connections: Not on file  Intimate Partner Violence: Not on file    FAMILY HISTORY: Family History  Problem Relation Age of Onset   Breast cancer Mother 81    ALLERGIES:  is allergic to pollen extract.  MEDICATIONS:  Current Outpatient Medications  Medication Sig Dispense Refill   acetaminophen (TYLENOL) 325 MG tablet Tylenol 325 mg tablet  Take 2 tablets every 6 hours by oral route.     ascorbic acid (VITAMIN C) 500 MG tablet Take by mouth.     budesonide-formoterol (SYMBICORT) 160-4.5 MCG/ACT inhaler Inhale 2 puffs into the lungs 2 (two) times daily. 1 each 6   dicyclomine (BENTYL) 20 MG tablet Take 1 tablet (20 mg  total) by mouth 4 (four) times daily -  before meals and at bedtime. Take as needed for pain and spasm 20 tablet 0   ELIQUIS 5 MG TABS tablet Take 5 mg by mouth 2 (two) times daily.     ferrous sulfate 324 MG TBEC Take 324 mg by mouth.     Multiple Vitamin (MULTIVITAMIN WITH MINERALS) TABS tablet Take 1 tablet by mouth daily.     No current facility-administered medications for this visit.    REVIEW OF SYSTEMS:   Constitutional: ( - ) fevers, ( - )  chills , ( - ) night sweats Eyes: ( - ) blurriness of vision, ( - ) double vision, ( - ) watery eyes Ears, nose, mouth, throat, and face: ( - ) mucositis, ( - ) sore throat Respiratory: ( - ) cough, ( - ) dyspnea, ( - ) wheezes Cardiovascular: ( - ) palpitation, ( - ) chest discomfort, ( - ) lower extremity swelling Gastrointestinal:  ( - ) nausea, ( - ) heartburn, ( - ) change in bowel habits Skin: ( - ) abnormal skin rashes Lymphatics: ( - ) new lymphadenopathy, ( - ) easy bruising Neurological: ( - ) numbness, ( - ) tingling, ( - ) new weaknesses Behavioral/Psych: ( - ) mood change, ( - ) new changes  All other systems were reviewed with the patient and are negative.  PHYSICAL EXAMINATION: ECOG PERFORMANCE STATUS: 0 - Asymptomatic  Vitals:   05/22/21 1412  BP: (!) 144/90  Pulse: 80  Resp: 17  Temp: 97.6 F (36.4 C)  SpO2: 98%   Filed Weights   05/22/21 1412  Weight: 247 lb (112 kg)    GENERAL: Well-appearing middle-age Caucasian female, alert, no distress and comfortable SKIN: skin color, texture, turgor are normal, no rashes or significant lesions EYES: conjunctiva are pink and non-injected, sclera clear LUNGS: clear to auscultation and percussion with normal breathing effort HEART: regular rate & rhythm and no murmurs and no lower extremity edema Musculoskeletal: no cyanosis of digits and no clubbing  PSYCH: alert & oriented x 3, fluent speech NEURO: no focal motor/sensory deficits  LABORATORY DATA:  I have reviewed  the data as listed CBC Latest Ref Rng & Units 05/22/2021 02/20/2021 01/27/2020  WBC 4.0 - 10.5 K/uL 6.5 6.1 12.1(H)  Hemoglobin 12.0 - 15.0 g/dL 13.1 10.5(L) 10.4(L)  Hematocrit 36.0 - 46.0 % 37.1 31.8(L) 35.1(L)  Platelets 150 - 400 K/uL 317 358 524(H)    CMP Latest Ref Rng & Units 05/22/2021 02/20/2021 01/27/2020  Glucose 70 - 99 mg/dL 104(H) 89 106(H)  BUN 6 - 20 mg/dL 9 9 5(L)  Creatinine 0.44 - 1.00 mg/dL 0.65 0.70 0.71  Sodium 135 - 145 mmol/L 140 140 135  Potassium 3.5 - 5.1 mmol/L  4.0 3.6 3.4(L)  Chloride 98 - 111 mmol/L 108 109 99  CO2 22 - 32 mmol/L 26 24 25   Calcium 8.9 - 10.3 mg/dL 9.2 8.8(L) 9.2  Total Protein 6.5 - 8.1 g/dL 6.7 6.4(L) 6.9  Total Bilirubin 0.3 - 1.2 mg/dL 1.1 0.7 1.0  Alkaline Phos 38 - 126 U/L 52 61 66  AST 15 - 41 U/L 14(L) 13(L) 16  ALT 0 - 44 U/L 18 16 22    RADIOGRAPHIC STUDIES: No results found.  ASSESSMENT & PLAN Tara Houston 51 y.o. female with medical history significant for iron deficiency anemia secondary to GYN bleeding who presents for a follow up visit.   # Iron Deficiency Anemia 2/2 GYN Bleeding -- Patient unable to tolerate p.o. iron therapy due to constipation and stomach upset -- She received IV iron sucrose 200 mg q. 7 days x 5 doses, with the last dose being on 03/27/2021 -- Labs today show white blood cell count 6.5, hemoglobin 13.1, MCV 90, and platelets of 317 --Defer management menstrual cycles to OB/GYN.  Recommend avoiding estrogen-based birth control given her lower extremity blood clots --Return to clinic in 6 months time to reevaluate iron levels and assure she continues to do well.  # Lower Extremity Blood Clots --currently on eliquis therapy, managed by [facility] -- Likely secondary to estrogen-based birth control, would consider this a provoked episode.  Images not available in our system will defer to these specialists for management moving forward --continue to monitor   No orders of the defined  types were placed in this encounter.   All questions were answered. The patient knows to call the clinic with any problems, questions or concerns.  A total of more than 30 minutes were spent on this encounter with face-to-face time and non-face-to-face time, including preparing to see the patient, ordering tests and/or medications, counseling the patient and coordination of care as outlined above.   Ledell Peoples, MD Department of Hematology/Oncology [place] at [facility] Phone: [phone] Pager: [phone] Email: [email or web address]  05/23/2021 5:25 PM

## 2021-05-25 ENCOUNTER — Telehealth: Payer: Self-pay | Admitting: Hematology and Oncology

## 2021-05-25 NOTE — Telephone Encounter (Signed)
Scheduled per 2/10 los, pt has been called and confirmed

## 2021-10-30 ENCOUNTER — Other Ambulatory Visit: Payer: Self-pay | Admitting: Obstetrics and Gynecology

## 2021-10-30 DIAGNOSIS — Z1231 Encounter for screening mammogram for malignant neoplasm of breast: Secondary | ICD-10-CM

## 2021-11-20 ENCOUNTER — Inpatient Hospital Stay (HOSPITAL_BASED_OUTPATIENT_CLINIC_OR_DEPARTMENT_OTHER): Payer: 59 | Admitting: Hematology and Oncology

## 2021-11-20 ENCOUNTER — Other Ambulatory Visit: Payer: Self-pay | Admitting: Hematology and Oncology

## 2021-11-20 ENCOUNTER — Inpatient Hospital Stay: Payer: 59 | Attending: Hematology and Oncology

## 2021-11-20 ENCOUNTER — Other Ambulatory Visit: Payer: Self-pay

## 2021-11-20 VITALS — BP 167/99 | HR 78 | Temp 98.8°F | Resp 16 | Wt 251.6 lb

## 2021-11-20 DIAGNOSIS — Z803 Family history of malignant neoplasm of breast: Secondary | ICD-10-CM | POA: Insufficient documentation

## 2021-11-20 DIAGNOSIS — R519 Headache, unspecified: Secondary | ICD-10-CM | POA: Diagnosis not present

## 2021-11-20 DIAGNOSIS — Z7901 Long term (current) use of anticoagulants: Secondary | ICD-10-CM | POA: Diagnosis not present

## 2021-11-20 DIAGNOSIS — D500 Iron deficiency anemia secondary to blood loss (chronic): Secondary | ICD-10-CM

## 2021-11-20 DIAGNOSIS — R030 Elevated blood-pressure reading, without diagnosis of hypertension: Secondary | ICD-10-CM | POA: Diagnosis not present

## 2021-11-20 DIAGNOSIS — N920 Excessive and frequent menstruation with regular cycle: Secondary | ICD-10-CM | POA: Diagnosis not present

## 2021-11-20 DIAGNOSIS — Z79899 Other long term (current) drug therapy: Secondary | ICD-10-CM | POA: Insufficient documentation

## 2021-11-20 LAB — RETIC PANEL
Immature Retic Fract: 8.9 % (ref 2.3–15.9)
RBC.: 4.15 MIL/uL (ref 3.87–5.11)
Retic Count, Absolute: 74.7 10*3/uL (ref 19.0–186.0)
Retic Ct Pct: 1.8 % (ref 0.4–3.1)
Reticulocyte Hemoglobin: 35.6 pg (ref >27.9)

## 2021-11-20 LAB — CBC WITH DIFFERENTIAL (CANCER CENTER ONLY)
Abs Immature Granulocytes: 0.01 10*3/uL (ref 0.00–0.07)
Basophils Absolute: 0.1 10*3/uL (ref 0.0–0.1)
Basophils Relative: 1 %
Eosinophils Absolute: 0.2 10*3/uL (ref 0.0–0.5)
Eosinophils Relative: 3 %
HCT: 37.9 % (ref 36.0–46.0)
Hemoglobin: 13.6 g/dL (ref 12.0–15.0)
Immature Granulocytes: 0 %
Lymphocytes Relative: 27 %
Lymphs Abs: 1.7 10*3/uL (ref 0.7–4.0)
MCH: 32.8 pg (ref 26.0–34.0)
MCHC: 35.9 g/dL (ref 30.0–36.0)
MCV: 91.3 fL (ref 80.0–100.0)
Monocytes Absolute: 0.5 10*3/uL (ref 0.1–1.0)
Monocytes Relative: 8 %
Neutro Abs: 3.8 10*3/uL (ref 1.7–7.7)
Neutrophils Relative %: 61 %
Platelet Count: 319 10*3/uL (ref 150–400)
RBC: 4.15 MIL/uL (ref 3.87–5.11)
RDW: 12.1 % (ref 11.5–15.5)
WBC Count: 6.2 10*3/uL (ref 4.0–10.5)
nRBC: 0 % (ref 0.0–0.2)

## 2021-11-20 LAB — CMP (CANCER CENTER ONLY)
ALT: 17 U/L (ref 0–44)
AST: 15 U/L (ref 15–41)
Albumin: 4.2 g/dL (ref 3.5–5.0)
Alkaline Phosphatase: 50 U/L (ref 38–126)
Anion gap: 7 (ref 5–15)
BUN: 11 mg/dL (ref 6–20)
CO2: 23 mmol/L (ref 22–32)
Calcium: 9 mg/dL (ref 8.9–10.3)
Chloride: 108 mmol/L (ref 98–111)
Creatinine: 0.73 mg/dL (ref 0.44–1.00)
GFR, Estimated: >60 mL/min (ref >60)
Glucose, Bld: 103 mg/dL — ABNORMAL HIGH (ref 70–99)
Potassium: 3.8 mmol/L (ref 3.5–5.1)
Sodium: 138 mmol/L (ref 135–145)
Total Bilirubin: 1.3 mg/dL — ABNORMAL HIGH (ref 0.3–1.2)
Total Protein: 6.8 g/dL (ref 6.5–8.1)

## 2021-11-20 LAB — IRON AND IRON BINDING CAPACITY (CC-WL,HP ONLY)
Iron: 144 ug/dL (ref 28–170)
Saturation Ratios: 41 % — ABNORMAL HIGH (ref 10.4–31.8)
TIBC: 354 ug/dL (ref 250–450)
UIBC: 210 ug/dL (ref 148–442)

## 2021-11-20 LAB — FERRITIN: Ferritin: 25 ng/mL (ref 11–307)

## 2021-11-20 NOTE — Progress Notes (Signed)
[place] [phone]   [phone]  PROGRESS NOTE  Patient Care Team: Pcp, No as PCP - General  Hematological/Oncological History # Iron Deficiency Anemia 2/2 GYN Bleeding 1) 11/20/2019: WBC 9.9, Hgb 8.0, MCV 78.6, Plt 535 2) 12/18/2019: WBC 5.2, Hgb 8.4, MCV 71, Plt 572. Iron 17, Iron Sat 4% 3) 01/14/2020: WBC 6.6, Hgb 9.7, MCV 78, Plt 590 4) 01/18/2020: establish care with Dr. Lorenso Courier. Recommend proceeding with PO iron therapy.  5) 01/18/2020-02/20/2021: lost to follow up 6) 02/20/2021: reconnected with Dr. Lorenso Courier. Hgb 10.5, MCV 83.2, Plt 358.   7) 11/18-12/16/2022: Received IV iron sucrose 200 mg x 5 doses  Interval History:  Tara Houston 51 y.o. female with medical history significant for iron deficiency anemia secondary to GYN bleeding who presents for a follow up visit. The patient's last visit was on 05/22/2021. In the interim since the last visit the patient has had no major changes in her health.  On exam today Tara Houston reports that she has been working with OB/GYN to better control her menstrual cycles.  She is currently on a progestin based birth control pill that she is concerned it is increasing her blood pressure.  She has elevated blood pressure today currently measured at 167/99.  She notes that she also has a headache.  She will be calling her OB/GYN today to discuss this.  She notes that she has not been having any bleeding or cramps while on this birth control.  She notes that she does periodically use Eliquis therapy "only undergoing vein treatments".  She notes that her next treatment is approximately 3 to 4 weeks.  She notes her energy levels have been good and overall her health has been stable.  She denies any overt signs of bleeding, bruising, or dark stools.  She otherwise denies any fevers, chills, sweats, nausea, vomiting or diarrhea.  Full 10 point ROS is listed below.  MEDICAL HISTORY:  Past Medical History:  Diagnosis Date    Asthma    exercise induced, as a young adult.   Bronchitis    Low iron    PONV (postoperative nausea and vomiting)     SURGICAL HISTORY: Past Surgical History:  Procedure Laterality Date   CYST EXCISION     removed from leg   DILITATION & CURRETTAGE/HYSTROSCOPY WITH HYDROTHERMAL ABLATION N/A 11/21/2019   Procedure: DILATATION & CURETTAGE/HYSTEROSCOPY WITH HYDROTHERMAL ABLATION;  Surgeon: Janyth Pupa, DO;  Location: [facility];  Service: Gynecology;  Laterality: N/A;   TUBAL LIGATION     VARICOSE VEIN SURGERY      SOCIAL HISTORY: Social History   Socioeconomic History   Marital status: Married    Spouse name: Pieter Partridge   Number of children: Not on file   Years of education: Not on file   Highest education level: Not on file  Occupational History   Not on file  Tobacco Use   Smoking status: Never   Smokeless tobacco: Never  Vaping Use   Vaping Use: Never used  Substance and Sexual Activity   Alcohol use: Yes    Comment: occ   Drug use: Never   Sexual activity: Not on file  Other Topics Concern   Not on file  Social History Narrative   Not on file   Social Determinants of Health   Financial Resource Strain: Not on file  Food Insecurity: Not on file  Transportation Needs: Not on file  Physical Activity: Not on file  Stress: Not on  file  Social Connections: Not on file  Intimate Partner Violence: Not on file    FAMILY HISTORY: Family History  Problem Relation Age of Onset   Breast cancer Mother 77    ALLERGIES:  is allergic to pollen extract.  MEDICATIONS:  Current Outpatient Medications  Medication Sig Dispense Refill   acetaminophen (TYLENOL) 325 MG tablet Tylenol 325 mg tablet  Take 2 tablets every 6 hours by oral route.     ascorbic acid (VITAMIN C) 500 MG tablet Take by mouth.     budesonide-formoterol (SYMBICORT) 160-4.5 MCG/ACT inhaler Inhale 2 puffs into the lungs 2 (two) times daily. 1 each 6   dicyclomine (BENTYL) 20 MG tablet  Take 1 tablet (20 mg total) by mouth 4 (four) times daily -  before meals and at bedtime. Take as needed for pain and spasm 20 tablet 0   ELIQUIS 5 MG TABS tablet Take 5 mg by mouth 2 (two) times daily.     ferrous sulfate 324 MG TBEC Take 324 mg by mouth.     Multiple Vitamin (MULTIVITAMIN WITH MINERALS) TABS tablet Take 1 tablet by mouth daily.     No current facility-administered medications for this visit.    REVIEW OF SYSTEMS:   Constitutional: ( - ) fevers, ( - )  chills , ( - ) night sweats Eyes: ( - ) blurriness of vision, ( - ) double vision, ( - ) watery eyes Ears, nose, mouth, throat, and face: ( - ) mucositis, ( - ) sore throat Respiratory: ( - ) cough, ( - ) dyspnea, ( - ) wheezes Cardiovascular: ( - ) palpitation, ( - ) chest discomfort, ( - ) lower extremity swelling Gastrointestinal:  ( - ) nausea, ( - ) heartburn, ( - ) change in bowel habits Skin: ( - ) abnormal skin rashes Lymphatics: ( - ) new lymphadenopathy, ( - ) easy bruising Neurological: ( - ) numbness, ( - ) tingling, ( - ) new weaknesses Behavioral/Psych: ( - ) mood change, ( - ) new changes  All other systems were reviewed with the patient and are negative.  PHYSICAL EXAMINATION: ECOG PERFORMANCE STATUS: 0 - Asymptomatic  Vitals:   11/20/21 1041  BP: (!) 167/99  Pulse: 78  Resp: 16  Temp: 98.8 F (37.1 C)  SpO2: 97%    Filed Weights   11/20/21 1041  Weight: 251 lb 9.6 oz (114.1 kg)     GENERAL: Well-appearing middle-age Caucasian female, alert, no distress and comfortable SKIN: skin color, texture, turgor are normal, no rashes or significant lesions EYES: conjunctiva are pink and non-injected, sclera clear LUNGS: clear to auscultation and percussion with normal breathing effort HEART: regular rate & rhythm and no murmurs and no lower extremity edema Musculoskeletal: no cyanosis of digits and no clubbing  PSYCH: alert & oriented x 3, fluent speech NEURO: no focal motor/sensory  deficits  LABORATORY DATA:  I have reviewed the data as listed    Latest Ref Rng & Units 11/20/2021   10:22 AM 05/22/2021    1:53 PM 02/20/2021   11:11 AM  CBC  WBC 4.0 - 10.5 K/uL 6.2  6.5  6.1   Hemoglobin 12.0 - 15.0 g/dL 13.6  13.1  10.5   Hematocrit 36.0 - 46.0 % 37.9  37.1  31.8   Platelets 150 - 400 K/uL 319  317  358        Latest Ref Rng & Units 11/20/2021   10:22 AM 05/22/2021    1:53  PM 02/20/2021   11:11 AM  CMP  Glucose 70 - 99 mg/dL 103  104  89   BUN 6 - 20 mg/dL '11  9  9   '$ Creatinine 0.44 - 1.00 mg/dL 0.73  0.65  0.70   Sodium 135 - 145 mmol/L 138  140  140   Potassium 3.5 - 5.1 mmol/L 3.8  4.0  3.6   Chloride 98 - 111 mmol/L 108  108  109   CO2 22 - 32 mmol/L '23  26  24   '$ Calcium 8.9 - 10.3 mg/dL 9.0  9.2  8.8   Total Protein 6.5 - 8.1 g/dL 6.8  6.7  6.4   Total Bilirubin 0.3 - 1.2 mg/dL 1.3  1.1  0.7   Alkaline Phos 38 - 126 U/L 50  52  61   AST 15 - 41 U/L '15  14  13   '$ ALT 0 - 44 U/L '17  18  16    '$ RADIOGRAPHIC STUDIES: No results found.  ASSESSMENT & PLAN Tara Houston 51 y.o. female with medical history significant for iron deficiency anemia secondary to GYN bleeding who presents for a follow up visit.   # Iron Deficiency Anemia 2/2 GYN Bleeding -- Patient unable to tolerate p.o. iron therapy due to constipation and stomach upset -- She received IV iron sucrose 200 mg q. 7 days x 5 doses, with the last dose being on 03/27/2021 -- Labs today show white blood cell count 6.2, hemoglobin 13.6, MCV 91.3, and platelets of 319 --Defer management menstrual cycles to OB/GYN.  Recommend avoiding estrogen-based birth control given her lower extremity blood clots --Return to clinic as needed moving forward  # Lower Extremity Blood Clots --currently on eliquis therapy around the time of vein treatment, managed by [facility] -- Likely secondary to estrogen-based birth control, would consider this a provoked episode.  Images not available in our  system will defer to these specialists for management moving forward --continue to monitor   No orders of the defined types were placed in this encounter.   All questions were answered. The patient knows to call the clinic with any problems, questions or concerns.  A total of more than 30 minutes were spent on this encounter with face-to-face time and non-face-to-face time, including preparing to see the patient, ordering tests and/or medications, counseling the patient and coordination of care as outlined above.   Ledell Peoples, MD Department of Hematology/Oncology [place] [facility] Phone: [phone] Pager: [phone] Email: [email or web address]  11/20/2021 1:03 PM

## 2021-12-04 ENCOUNTER — Ambulatory Visit
Admission: RE | Admit: 2021-12-04 | Discharge: 2021-12-04 | Disposition: A | Discharge status: Home / Self Care | Payer: 59 | Source: Ambulatory Visit | Attending: Obstetrics and Gynecology | Admitting: Obstetrics and Gynecology

## 2021-12-04 DIAGNOSIS — Z1231 Encounter for screening mammogram for malignant neoplasm of breast: Secondary | ICD-10-CM

## 2022-01-21 ENCOUNTER — Ambulatory Visit
Admission: RE | Admit: 2022-01-21 | Discharge: 2022-01-21 | Disposition: A | Discharge status: Home / Self Care | Payer: 59 | Source: Ambulatory Visit | Attending: Sports Medicine | Admitting: Sports Medicine

## 2022-01-21 ENCOUNTER — Other Ambulatory Visit: Payer: Self-pay | Admitting: Sports Medicine

## 2022-01-21 DIAGNOSIS — M25551 Pain in right hip: Secondary | ICD-10-CM

## 2022-03-15 ENCOUNTER — Other Ambulatory Visit: Payer: Self-pay | Admitting: Sports Medicine

## 2022-03-15 ENCOUNTER — Ambulatory Visit
Admission: RE | Admit: 2022-03-15 | Discharge: 2022-03-15 | Disposition: A | Discharge status: Home / Self Care | Payer: 59 | Source: Ambulatory Visit | Attending: Sports Medicine | Admitting: Sports Medicine

## 2022-03-15 DIAGNOSIS — M25552 Pain in left hip: Secondary | ICD-10-CM

## 2022-03-15 DIAGNOSIS — M25561 Pain in right knee: Secondary | ICD-10-CM

## 2022-03-20 IMAGING — MG DIGITAL SCREENING BILAT W/ CAD
4 series · 4 of 4 positions shown · non-contrast
Comparison: Previous exam(s).

CLINICAL DATA: Screening.

EXAM:
DIGITAL SCREENING BILATERAL MAMMOGRAM WITH CAD
TECHNIQUE: Bilateral screening digital craniocaudal and mediolateral oblique
mammograms were obtained. The images were evaluated with
computer-aided detection.

[L CC]
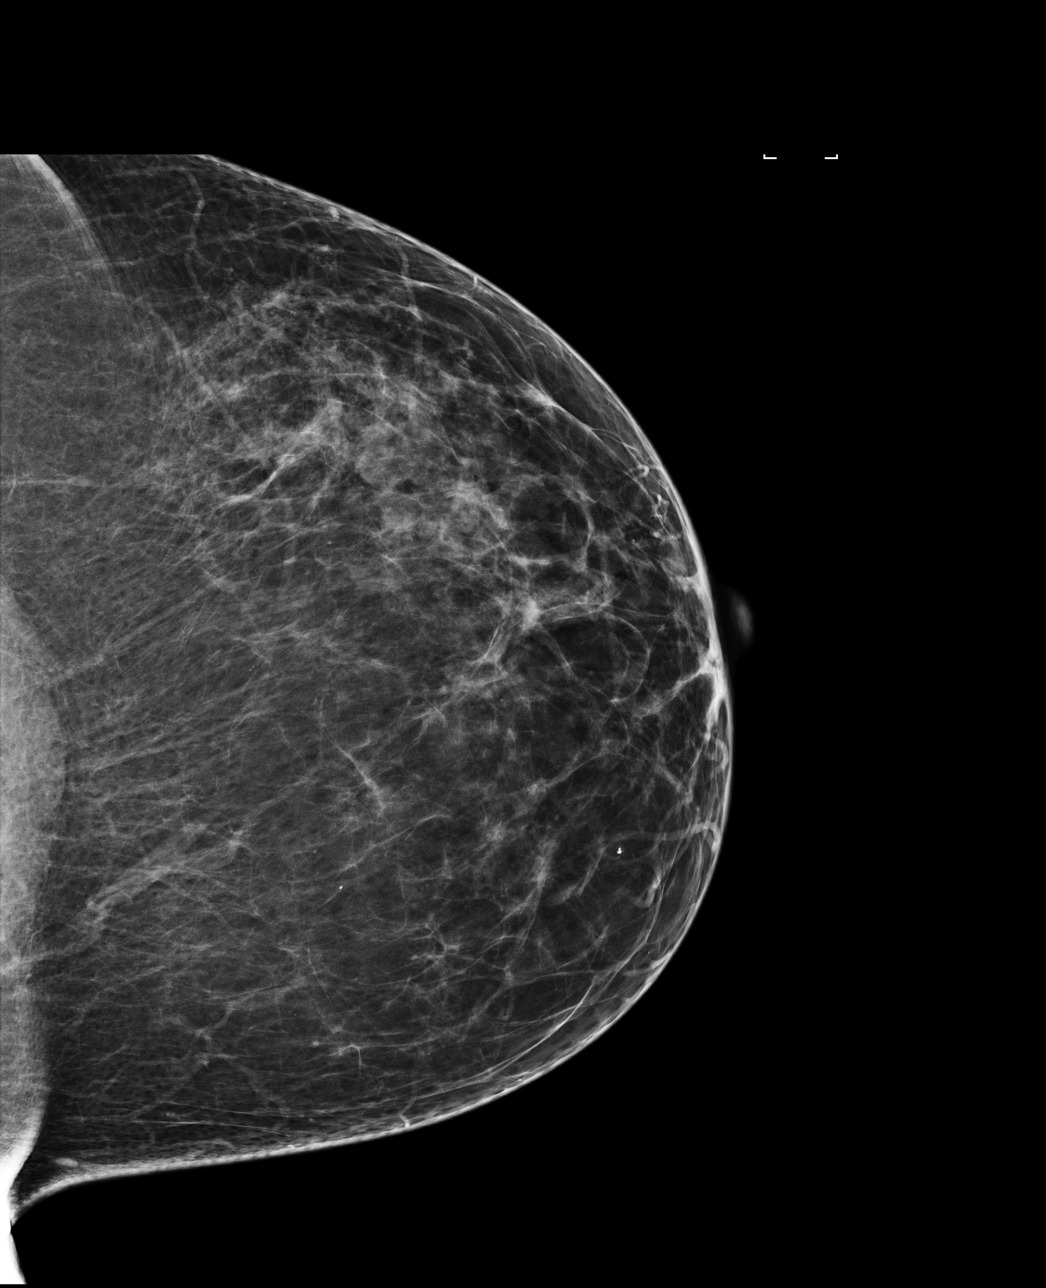

[L MLO]
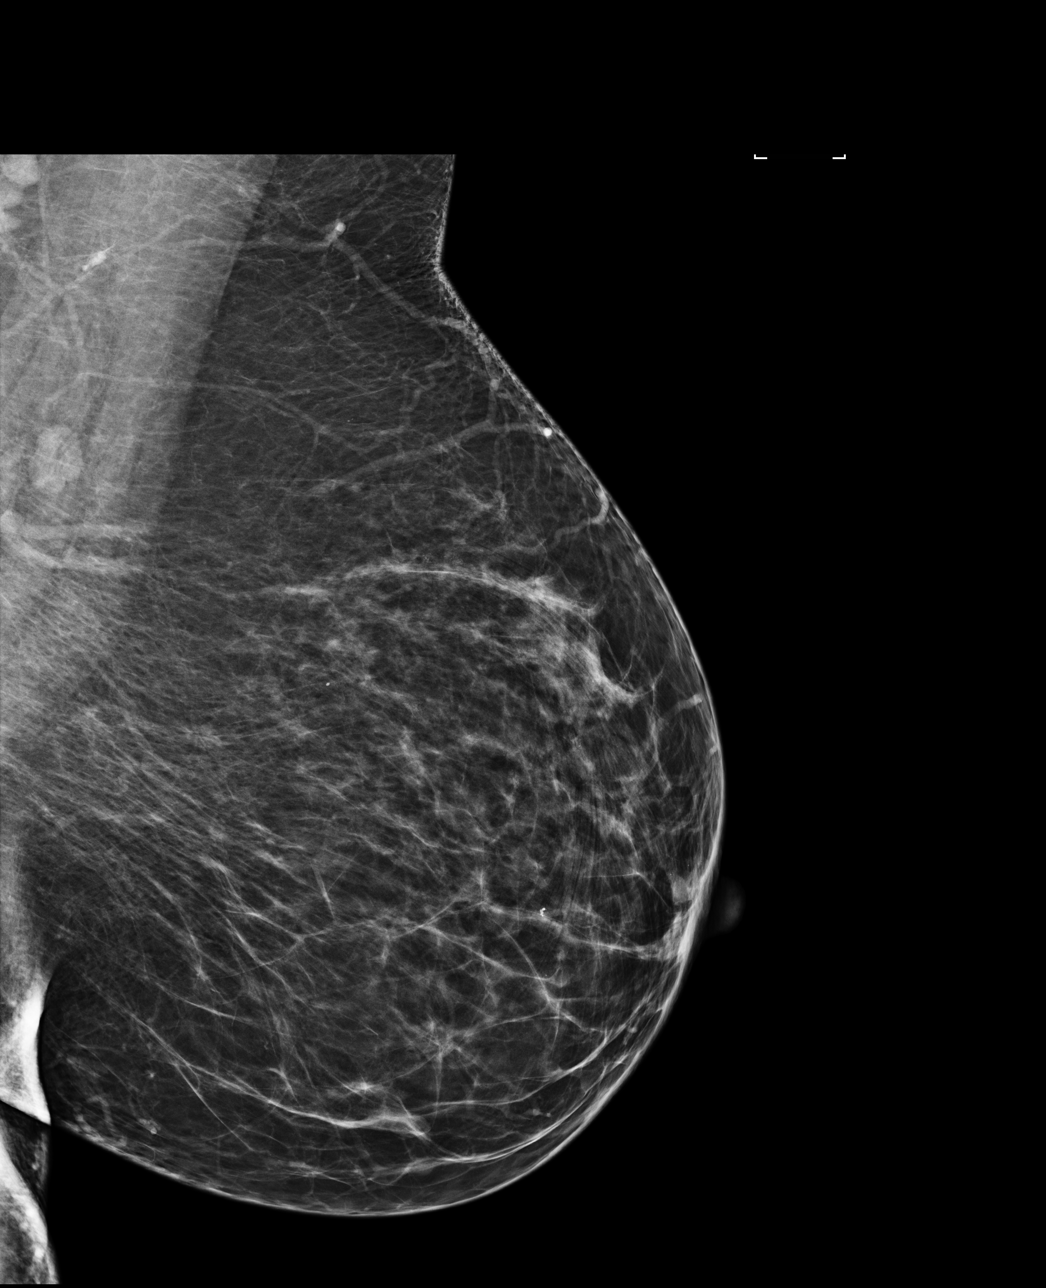

[R MLO]
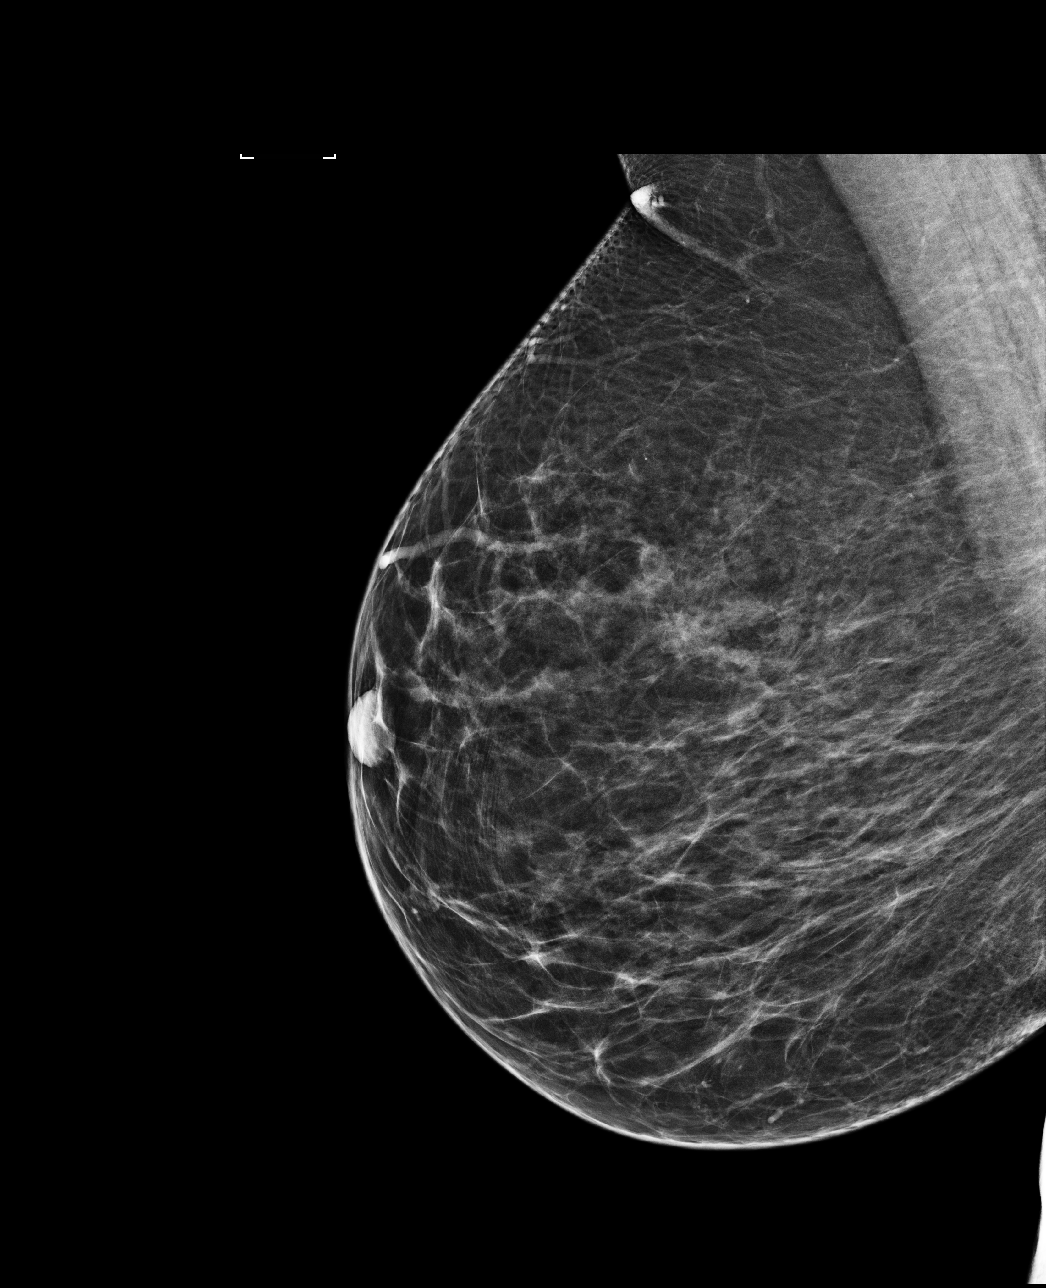

[R CC]
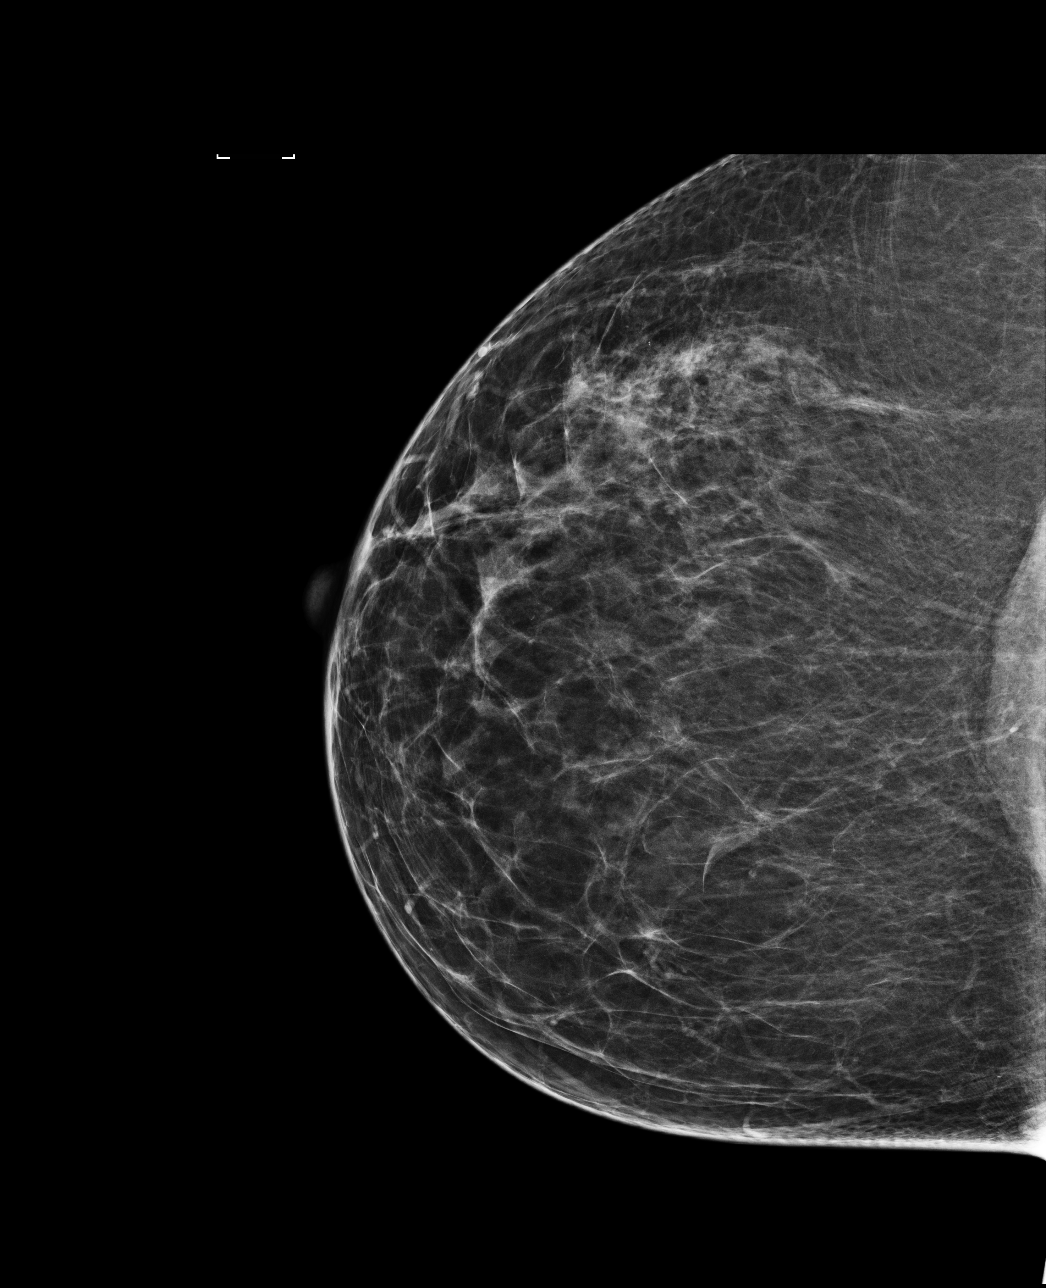

[4 of 4 positions shown; findings below may reference images not displayed]

ACR Breast Density Category b: There are scattered areas of
fibroglandular density.
FINDINGS: There are no findings suspicious for malignancy.
IMPRESSION: No mammographic evidence of malignancy. A result letter of this
screening mammogram will be mailed directly to the patient.

RECOMMENDATION:
Screening mammogram in one year. (Code:[id number]-V-ZRK)

BI-RADS CATEGORY  1: Negative.

## 2022-11-09 ENCOUNTER — Other Ambulatory Visit: Payer: Self-pay | Admitting: Family Medicine

## 2022-11-09 DIAGNOSIS — Z1231 Encounter for screening mammogram for malignant neoplasm of breast: Secondary | ICD-10-CM

## 2022-12-15 ENCOUNTER — Ambulatory Visit
Admission: RE | Admit: 2022-12-15 | Discharge: 2022-12-15 | Disposition: A | Discharge status: Home / Self Care | Payer: 59 | Source: Ambulatory Visit | Attending: Family Medicine | Admitting: Family Medicine

## 2022-12-15 DIAGNOSIS — Z1231 Encounter for screening mammogram for malignant neoplasm of breast: Secondary | ICD-10-CM

## 2023-11-16 ENCOUNTER — Other Ambulatory Visit: Payer: Self-pay | Admitting: Family Medicine

## 2023-11-16 DIAGNOSIS — Z1231 Encounter for screening mammogram for malignant neoplasm of breast: Secondary | ICD-10-CM

## 2023-12-19 ENCOUNTER — Ambulatory Visit
Admission: RE | Admit: 2023-12-19 | Discharge: 2023-12-19 | Disposition: A | Discharge status: Home / Self Care | Source: Ambulatory Visit | Attending: Family Medicine | Admitting: Family Medicine

## 2023-12-19 DIAGNOSIS — Z1231 Encounter for screening mammogram for malignant neoplasm of breast: Secondary | ICD-10-CM
